# Patient Record
Sex: Female | Born: 1980 | Hispanic: Yes | Marital: Married | Smoking: Never smoker
Health system: Southern US, Community
[De-identification: ages and names within clinical notes are randomized; demographics above are authoritative.]

## PROBLEM LIST (undated history)

## (undated) ENCOUNTER — Inpatient Hospital Stay (HOSPITAL_COMMUNITY): Payer: Self-pay

## (undated) DIAGNOSIS — B999 Unspecified infectious disease: Secondary | ICD-10-CM

## (undated) DIAGNOSIS — D649 Anemia, unspecified: Secondary | ICD-10-CM

## (undated) DIAGNOSIS — T8859XA Other complications of anesthesia, initial encounter: Secondary | ICD-10-CM

## (undated) DIAGNOSIS — O039 Complete or unspecified spontaneous abortion without complication: Secondary | ICD-10-CM

## (undated) DIAGNOSIS — O139 Gestational [pregnancy-induced] hypertension without significant proteinuria, unspecified trimester: Secondary | ICD-10-CM

## (undated) DIAGNOSIS — N83209 Unspecified ovarian cyst, unspecified side: Secondary | ICD-10-CM

## (undated) DIAGNOSIS — O1420 HELLP syndrome (HELLP), unspecified trimester: Secondary | ICD-10-CM

## (undated) DIAGNOSIS — T4145XA Adverse effect of unspecified anesthetic, initial encounter: Secondary | ICD-10-CM

## (undated) HISTORY — DX: Gestational (pregnancy-induced) hypertension without significant proteinuria, unspecified trimester: O13.9

## (undated) HISTORY — DX: HELLP syndrome (HELLP), unspecified trimester: O14.20

## (undated) HISTORY — DX: Other complications of anesthesia, initial encounter: T88.59XA

## (undated) HISTORY — PX: NO PAST SURGERIES: SHX2092

## (undated) NOTE — *Deleted (*Deleted)
OBSTETRIC ADMISSION HISTORY AND PHYSICAL  Kristina Gordon is a 89 y.o. female G43P1011 with IUP at [redacted]w[redacted]d by *** presenting for ***. She reports +FMs, No LOF, no VB, no blurry vision, headaches or peripheral edema, and RUQ pain.  She plans on *** feeding. She request *** for birth control. She received her prenatal care at {Blank single:19197::"[facility]","[facility]"}   Dating: By *** --->  Estimated Date of Delivery: 03/29/20  Sono:   @***w***d, normal anatomy, *** presentation, ***  *** lie, ***g, ***% EFW   Prenatal History/Complications: ***  Past Medical History: Past Medical History:  Diagnosis Date  . Anemia   . Complication of anesthesia    pt. states it took her " time " to wake up   . HELLP (hemolytic anemia/elev liver enzymes/low platelets in pregnancy)   . Infection    UTI  . Miscarriage   . Ovarian cyst   . Pregnancy induced hypertension     Past Surgical History: Past Surgical History:  Procedure Laterality Date  . CESAREAN SECTION N/A 06/10/2015   Procedure: CESAREAN SECTION;  Surgeon: Jaymes Graff, MD;  Location: [facility];  Service: Obstetrics;  Laterality: N/A;    Obstetrical History: OB History    Gravida  3   Para  1   Term  1   Preterm      AB  1   Living  1     SAB  0   TAB  0   Ectopic  1   Multiple  0   Live Births  1           Social History Social History   Socioeconomic History  . Marital status: Married    Spouse name: Not on file  . Number of children: Not on file  . Years of education: Not on file  . Highest education level: Not on file  Occupational History  . Not on file  Tobacco Use  . Smoking status: Never Smoker  . Smokeless tobacco: Never Used  Vaping Use  . Vaping Use: Never used  Substance and Sexual Activity  . Alcohol use: No  . Drug use: No  . Sexual activity: Yes    Birth control/protection: None  Other Topics Concern  . Not on file  Social History Narrative  . Not on file    Social Determinants of Health   Financial Resource Strain:   . Difficulty of Paying Living Expenses: Not on file  Food Insecurity: No Food Insecurity  . Worried About Programme researcher, broadcasting/film/video in the Last Year: Never true  . Ran Out of Food in the Last Year: Never true  Transportation Needs: No Transportation Needs  . Lack of Transportation (Medical): No  . Lack of Transportation (Non-Medical): No  Physical Activity:   . Days of Exercise per Week: Not on file  . Minutes of Exercise per Session: Not on file  Stress:   . Feeling of Stress : Not on file  Social Connections:   . Frequency of Communication with Friends and Family: Not on file  . Frequency of Social Gatherings with Friends and Family: Not on file  . Attends Religious Services: Not on file  . Active Member of Clubs or Organizations: Not on file  . Attends Banker Meetings: Not on file  . Marital Status: Not on file    Family History: Family History  Problem Relation Age of Onset  . Hypertension Maternal Grandfather   . Cancer Maternal Grandfather  leukemia  . Varicose Veins Mother   . Hyperlipidemia Father     Allergies: No Known Allergies  Medications Prior to Admission  Medication Sig Dispense Refill Last Dose  . aspirin EC 81 MG tablet Take 1 tablet (81 mg total) by mouth daily. Take after 12 weeks for prevention of preeclampsia later in pregnancy 300 tablet 2   . Prenatal Multivit-Min-Fe-FA (PRENATAL/IRON) TABS Take 1 tablet by mouth daily.         Review of Systems   All systems reviewed and negative except as stated in HPI  Blood pressure (!) 153/109, pulse 91, temperature 98 F (36.7 C), temperature source Oral, height 4\' 8"  (1.422 m), weight 55.9 kg, last menstrual period 06/23/2019, unknown if currently breastfeeding. General appearance: {general exam:16600} Lungs: clear to auscultation bilaterally Heart: regular rate and rhythm Abdomen: soft, non-tender; bowel sounds normal  Pelvic: *** Extremities: Homans sign is negative, no sign of DVT DTR's *** Presentation: {desc; fetal presentation:14558} Fetal monitoring{findings; monitor fetal heart monitor:31527} Uterine activity{Uterine contractions:31516}     Prenatal labs: ABO, Rh: A/Positive/-- (07/14 1513) Antibody: Negative (07/14 1513) Rubella: 11.30 (07/14 1513) RPR: Non Reactive (08/12 0835)  HBsAg: Negative (07/14 1513)  HIV: Non Reactive (08/12 0835)  GBS: Negative/-- (10/07 1331)  1 hr Glucola *** Genetic screening  *** Anatomy US ***  Prenatal Transfer Tool  Maternal Diabetes: {Maternal Diabetes:3043596} Genetic Screening: {Genetic Screening:20205} Maternal Ultrasounds/Referrals: {Maternal Ultrasounds / Referrals:20211} Fetal Ultrasounds or other Referrals:  {Fetal Ultrasounds or Other Referrals:20213} Maternal Substance Abuse:  {Maternal Substance Abuse:20223} Significant Maternal Medications:  {Significant Maternal Meds:20233} Significant Maternal Lab Results: {Significant Maternal Lab Results:20235}  No results found for this or any previous visit (from the past 24 hour(s)).  Patient Active Problem List   Diagnosis Date Noted  . 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 Marginal insertion of umbilical cord affecting management of mother 01/10/2020  . Supervision of high risk pregnancy, antepartum 12/12/2019  . History of HELLP syndrome, currently pregnant 12/12/2019  . S/P cesarean section 06/13/2015  . Language barrier 03/10/2015  . Syncope 03/10/2015  . AMA (advanced maternal age) multigravida 35+ 03/10/2015  . SS (short stature) 03/10/2015    Assessment/Plan:  Kristina Gordon is a 43 y.o. G3P1011 at [redacted]w[redacted]d here for***  #Labor:*** #Pain: *** #FWB: *** #ID:  *** #MOF: *** #MOC:*** #Circ:  ***  Herby Abraham MD, PGY-1

---
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
## 2013-11-10 ENCOUNTER — Inpatient Hospital Stay (HOSPITAL_COMMUNITY): Payer: Self-pay

## 2013-11-10 ENCOUNTER — Ambulatory Visit (INDEPENDENT_AMBULATORY_CARE_PROVIDER_SITE_OTHER): Payer: Self-pay | Admitting: Internal Medicine

## 2013-11-10 ENCOUNTER — Encounter (HOSPITAL_COMMUNITY): Payer: Self-pay

## 2013-11-10 ENCOUNTER — Inpatient Hospital Stay (HOSPITAL_COMMUNITY)
Admission: AD | Admit: 2013-11-10 | Discharge: 2013-11-10 | Disposition: A | Discharge status: Home / Self Care | Payer: Self-pay | Source: Ambulatory Visit | Attending: Obstetrics and Gynecology | Admitting: Obstetrics and Gynecology

## 2013-11-10 VITALS — BP 110/80 | HR 71 | Temp 98.3°F | Resp 16 | Ht <= 58 in | Wt 106.4 lb

## 2013-11-10 DIAGNOSIS — O9989 Other specified diseases and conditions complicating pregnancy, childbirth and the puerperium: Secondary | ICD-10-CM

## 2013-11-10 DIAGNOSIS — N912 Amenorrhea, unspecified: Secondary | ICD-10-CM

## 2013-11-10 DIAGNOSIS — R1032 Left lower quadrant pain: Secondary | ICD-10-CM | POA: Insufficient documentation

## 2013-11-10 DIAGNOSIS — R109 Unspecified abdominal pain: Secondary | ICD-10-CM

## 2013-11-10 DIAGNOSIS — O99891 Other specified diseases and conditions complicating pregnancy: Secondary | ICD-10-CM | POA: Insufficient documentation

## 2013-11-10 DIAGNOSIS — O26899 Other specified pregnancy related conditions, unspecified trimester: Secondary | ICD-10-CM

## 2013-11-10 DIAGNOSIS — O26859 Spotting complicating pregnancy, unspecified trimester: Secondary | ICD-10-CM | POA: Insufficient documentation

## 2013-11-10 LAB — POCT URINALYSIS DIPSTICK
Bilirubin, UA: NEGATIVE
Glucose, UA: NEGATIVE
Ketones, UA: NEGATIVE
LEUKOCYTES UA: NEGATIVE
Nitrite, UA: NEGATIVE
PROTEIN UA: NEGATIVE
Spec Grav, UA: <=1.005
UROBILINOGEN UA: 0.2
pH, UA: 6

## 2013-11-10 LAB — POCT UA - MICROSCOPIC ONLY
CASTS, UR, LPF, POC: NEGATIVE
CRYSTALS, UR, HPF, POC: NEGATIVE
Mucus, UA: NEGATIVE
YEAST UA: NEGATIVE

## 2013-11-10 LAB — CBC
HCT: 33.7 % — ABNORMAL LOW (ref 36.0–46.0)
Hemoglobin: 11.3 g/dL — ABNORMAL LOW (ref 12.0–15.0)
MCH: 29 pg (ref 26.0–34.0)
MCHC: 33.5 g/dL (ref 30.0–36.0)
MCV: 86.4 fL (ref 78.0–100.0)
Platelets: 269 10*3/uL (ref 150–400)
RBC: 3.9 MIL/uL (ref 3.87–5.11)
RDW: 14 % (ref 11.5–15.5)
WBC: 5.5 10*3/uL (ref 4.0–10.5)

## 2013-11-10 LAB — HCG, QUANTITATIVE, PREGNANCY: hCG, Beta Chain, Quant, S: 388 m[IU]/mL — ABNORMAL HIGH (ref <5)

## 2013-11-10 LAB — ABO/RH: ABO/RH(D): A POS

## 2013-11-10 LAB — POCT URINE PREGNANCY: Preg Test, Ur: POSITIVE

## 2013-11-10 NOTE — MAU Provider Note (Signed)
Attestation of Attending Supervision of Advanced Practitioner: Evaluation and management procedures were performed by the PA/NP/CNM/OB Fellow under my supervision/collaboration. Chart reviewed and agree with management and plan.  Maziah Smola V 11/10/2013 6:11 PM

## 2013-11-10 NOTE — Progress Notes (Signed)
Subjective:    Patient ID: Kristina Gordon, female    DOB: 12/18/1980, 33 y.o.   MRN: [id number]  This chart was scribed for Ellamae Siaobert Berlyn Saylor, MD by Jarvis Morganaylor Ferguson, Medical Scribe. First urgent medical and family care visit  HPI HPI Comments: Kristina FlingRosa Gordon is a 33 y.o. female who presents to the Emergency Department complaining of intermittent, mild, LLQ abdominal pain. She states she is having associated mild subjective fever, mild dysuria and back pain. She states that her LNMP was on 10/05/13 and that it was shorter than normal and the blood was darker in color that usual. Patient is unsure if she could be pregnant. She denies any nausea or nocturia.  With translator- has had irregular periods for a long time and wastold in [place] that she not be able to get pregnant because her ovulation was too infrequent. Uses no contraception .same partner for many years .has never been pregnant   in recent months she has had vaginal bleeding for 7-10 days when she does bleed .most recent ten-day stretch was early in May although she has had some spotting in the last 24 hours   she has now complained of left lower quadrant pain over the last 30 hours which kept her awake all night  Mild nausea today but no vomiting No dysuria or frequency or urgency No other health problems known    Review of Systems  Constitutional: Positive for fever (subjective).  Gastrointestinal: Positive for abdominal pain (LLQ). Negative for nausea.  Genitourinary: Positive for dysuria (mild).       No nocturia  Musculoskeletal: Positive for back pain.  All other systems reviewed and are negative.  no bruising or bleeding from gums      Objective:   Physical Exam  Nursing note and vitals reviewed. Constitutional: She is oriented to person, place, and time. She appears well-developed and well-nourished. No distress.  HENT:  Head: Normocephalic and atraumatic.  Eyes: Conjunctivae and EOM are normal.  Neck: Normal range  of motion.  Cardiovascular: Normal rate.   Pulmonary/Chest: Effort normal. No respiratory distress.  Abdominal: Soft. There is tenderness. There is no rebound and no guarding.  LLQ tenderness to palpation w/o mass. No CVA tenderness to percussion  Genitourinary:  Introitus clear There is a large slightly friable cervical polyp extruding from the os The uterus is enlarged approximately 1/2 way to umbilicus, and mildly tender She is mildly tender in both adnexae without additional mass impression Fetal heart tone monitor reveals no fetal heart tones but there is a loud placental souffl or sounds from the iliacs on each side  Musculoskeletal: Normal range of motion.  Neurological: She is alert and oriented to person, place, and time.  Skin: Skin is warm and dry.  Psychiatric: She has a normal mood and affect. Her behavior is normal.    Results for orders placed in visit on 11/10/13  POCT UA - MICROSCOPIC ONLY      Result Value Ref Range   WBC, Ur, HPF, POC 0-1     RBC, urine, microscopic 2-4     Bacteria, U Microscopic trace     Mucus, UA negative     Epithelial cells, urine per micros 2-8     Crystals, Ur, HPF, POC negative     Casts, Ur, LPF, POC negative     Yeast, UA negative    POCT URINALYSIS DIPSTICK      Result Value Ref Range   Color, UA yellow     Clarity,  UA clear     Glucose, UA negative     Bilirubin, UA negative     Ketones, UA negative     Spec Grav, UA <=1.005     Blood, UA moderate     pH, UA 6.0     Protein, UA negative     Urobilinogen, UA 0.2     Nitrite, UA negative     Leukocytes, UA Negative    POCT URINE PREGNANCY      Result Value Ref Range   Preg Test, Ur Positive          Assessment &  Plan:  Left lower quadrant abdominal pain Intrauterine pregnancy possibly nonviable  [facility] and definitive evaluation  I have completed the patient encounter in its entirety as documented by the scribe, with editing  by me where necessary. Lydia Meng P. Merla Richesoolittle, M.D.

## 2013-11-10 NOTE — MAU Provider Note (Signed)
History     CSN: [id number]  Arrival date and time: 11/10/13 1024   None     Chief Complaint  Patient presents with  . Abdominal Pain  . Back Pain   HPI 33 y.o. G1P0 at 5034w1d by LMP with LLQ pain x 2 days. + Spotting. Was sent from Urgent Care for further eval, pelvic exam there revealed mild bilateral adnexal tenderness and cervical polyp.   Past Medical History  Diagnosis Date  . Medical history non-contributory     Past Surgical History  Procedure Laterality Date  . No past surgeries      History reviewed. No pertinent family history.  History  Substance Use Topics  . Smoking status: Never Smoker   . Smokeless tobacco: Never Used  . Alcohol Use: No    Allergies: No Known Allergies  Prescriptions prior to admission  Medication Sig Dispense Refill  . Acetaminophen-Caff-Pyrilamine 500-60-15 MG TABS Take 1 tablet by mouth daily as needed (for pain/cramps).      . APAP-Pamabrom-Pyrilamine (MIDOL MAXIMUM STRENGTH PMS) 500-25-15 MG TABS Take 1 tablet by mouth daily as needed (for pain/cramps).        Review of Systems  Constitutional: Negative.   Respiratory: Negative.   Cardiovascular: Negative.   Gastrointestinal: Positive for abdominal pain. Negative for nausea, vomiting, diarrhea and constipation.  Genitourinary: Negative for dysuria, urgency, frequency, hematuria and flank pain.       Positive for spotting, negative discharge  Musculoskeletal: Negative.   Neurological: Negative.   Psychiatric/Behavioral: Negative.    Physical Exam   Blood pressure 124/80, pulse 79, temperature 98.7 F (37.1 C), temperature source Oral, resp. rate 16, last menstrual period 10/05/2013.  Physical Exam  Nursing note and vitals reviewed. Constitutional: She is oriented to person, place, and time. She appears well-developed and well-nourished. No distress.  Cardiovascular: Normal rate.   Respiratory: Effort normal.  GI: Soft. She exhibits no mass. There is tenderness (LLQ  ). There is no rebound and no guarding.  Musculoskeletal: Normal range of motion.  Neurological: She is alert and oriented to person, place, and time.  Skin: Skin is warm and dry.    MAU Course  Procedures Results for orders placed during the hospital encounter of 11/10/13 (from the past 24 hour(s))  HCG, QUANTITATIVE, PREGNANCY     Status: Abnormal   Collection Time    11/10/13 11:49 AM      Result Value Ref Range   hCG, Beta Chain, Quant, S 388 (*) <5 mIU/mL  CBC     Status: Abnormal   Collection Time    11/10/13 11:49 AM      Result Value Ref Range   WBC 5.5  4.0 - 10.5 K/uL   RBC 3.90  3.87 - 5.11 MIL/uL   Hemoglobin 11.3 (*) 12.0 - 15.0 g/dL   HCT 40.933.7 (*) 81.136.0 - 91.446.0 %   MCV 86.4  78.0 - 100.0 fL   MCH 29.0  26.0 - 34.0 pg   MCHC 33.5  30.0 - 36.0 g/dL   RDW 78.214.0  95.611.5 - 21.315.5 %   Platelets 269  150 - 400 K/uL  ABO/RH     Status: None   Collection Time    11/10/13 11:49 AM      Result Value Ref Range   ABO/RH(D) A POS     [place] Ob Comp Less 14 Wks  11/10/2013   CLINICAL DATA:  Left lower quadrant pain.  Bleeding.  EXAM: OBSTETRIC <14 WK ULTRASOUND  TECHNIQUE:  Transabdominal ultrasound was performed for evaluation of the gestation as well as the maternal uterus and adnexal regions.  COMPARISON:  None.  FINDINGS: Intrauterine gestational sac: None visualized  Yolk sac:  None visualized  Embryo:  None visualized  Maternal uterus/adnexae: Ovaries are symmetric in size and echotexture. No adnexal masses. Small amount of free fluid in the pelvis. Uterus is retroverted.  IMPRESSION: No intrauterine pregnancy visualized. Differential considerations would include early intrauterine pregnancy too early to visualize, spontaneous abortion, or occult ectopic pregnancy. Recommend close clinical followup and serial quantitative beta HCGs and ultrasounds.   Electronically Signed   By: Charlett NoseKevin  Dover M.D.   On: 11/10/2013 14:36   [place] Ob Transvaginal  11/10/2013   CLINICAL DATA:  Left lower  quadrant pain.  Bleeding.  EXAM: OBSTETRIC <14 WK ULTRASOUND  TECHNIQUE: Transabdominal ultrasound was performed for evaluation of the gestation as well as the maternal uterus and adnexal regions.  COMPARISON:  None.  FINDINGS: Intrauterine gestational sac: None visualized  Yolk sac:  None visualized  Embryo:  None visualized  Maternal uterus/adnexae: Ovaries are symmetric in size and echotexture. No adnexal masses. Small amount of free fluid in the pelvis. Uterus is retroverted.  IMPRESSION: No intrauterine pregnancy visualized. Differential considerations would include early intrauterine pregnancy too early to visualize, spontaneous abortion, or occult ectopic pregnancy. Recommend close clinical followup and serial quantitative beta HCGs and ultrasounds.   Electronically Signed   By: Charlett NoseKevin  Dover M.D.   On: 11/10/2013 14:36    Assessment and Plan   1. Abdominal pain in pregnancy, antepartum   No IUP or adnexal mass on u/s today, f/u quant Tuesday, precautions rev'd    Medication List    STOP taking these medications       Acetaminophen-Caff-Pyrilamine 500-60-15 MG Tabs     MIDOL MAXIMUM STRENGTH PMS 500-25-15 MG Tabs  Generic drug:  APAP-Pamabrom-Pyrilamine            Follow-up Information   [facility] In 3 days. (for repeat labs)    Contact information:   [address] [id number] [place] [id number] [phone]        Anessia Oakland 11/10/2013, 3:11 PM

## 2013-11-13 ENCOUNTER — Inpatient Hospital Stay (HOSPITAL_COMMUNITY)
Admission: AD | Admit: 2013-11-13 | Discharge: 2013-11-13 | Disposition: A | Discharge status: Home / Self Care | Payer: Self-pay | Source: Ambulatory Visit | Attending: Obstetrics & Gynecology | Admitting: Obstetrics & Gynecology

## 2013-11-13 DIAGNOSIS — O26899 Other specified pregnancy related conditions, unspecified trimester: Secondary | ICD-10-CM

## 2013-11-13 DIAGNOSIS — R109 Unspecified abdominal pain: Secondary | ICD-10-CM | POA: Insufficient documentation

## 2013-11-13 DIAGNOSIS — O26859 Spotting complicating pregnancy, unspecified trimester: Secondary | ICD-10-CM | POA: Insufficient documentation

## 2013-11-13 DIAGNOSIS — O9989 Other specified diseases and conditions complicating pregnancy, childbirth and the puerperium: Secondary | ICD-10-CM

## 2013-11-13 LAB — HCG, QUANTITATIVE, PREGNANCY: hCG, Beta Chain, Quant, S: 263 m[IU]/mL — ABNORMAL HIGH (ref <5)

## 2013-11-13 NOTE — MAU Provider Note (Signed)
  History     CSN: [id number]  Arrival date and time: 11/13/13 1028   None     Chief Complaint  Patient presents with  . Follow-up   HPI  Pt is a 33 yo G1P0 at 495w4d wks IUP here for repeat bhcg.  Seen in MAU on 6/13 for LLQ pelvic pain.  BHCG that day was 388.  Ultrasound showed no IUGS or adnexal mass.  Pt reports mild pelvic pain and spotting of blood with wiping.  Pain last approximated 30 sec.    Past Medical History  Diagnosis Date  . Medical history non-contributory     Past Surgical History  Procedure Laterality Date  . No past surgeries      No family history on file.  History  Substance Use Topics  . Smoking status: Never Smoker   . Smokeless tobacco: Never Used  . Alcohol Use: No    Allergies: No Known Allergies  No prescriptions prior to admission    Review of Systems  Gastrointestinal: Positive for abdominal pain (pelvic pain).  Genitourinary:       Spotting of blood.  All other systems reviewed and are negative.  Physical Exam   Blood pressure 114/82, pulse 82, temperature 98.9 F (37.2 C), temperature source Oral, resp. rate 16, last menstrual period 10/05/2013.  Physical Exam  Constitutional: She is oriented to person, place, and time. She appears well-developed and well-nourished. No distress.  HENT:  Head: Normocephalic.  Neck: Normal range of motion. Neck supple.  Cardiovascular: Normal rate, regular rhythm and normal heart sounds.   Respiratory: Effort normal and breath sounds normal.  GI: Soft.  Musculoskeletal: Normal range of motion. She exhibits no edema.  Neurological: She is alert and oriented to person, place, and time. She has normal reflexes.  Skin: Skin is warm and dry.    MAU Course  Procedures  Results for orders placed during the hospital encounter of 11/13/13 (from the past 24 hour(s))  HCG, QUANTITATIVE, PREGNANCY     Status: Abnormal   Collection Time    11/13/13 10:45 AM      Result Value Ref Range   hCG, Beta  Chain, Quant, S 263 (*) <5 mIU/mL    Assessment and Plan  Abdominal Pain/Bleeding in Pregnancy  Plan: Repeat BHCG in 48 hrs at clinic Ectopic and bleeding precautions Explained possible miscarriage.  [facility] Heights[facility]UHAMMAD,WALIDAH 11/13/2013, 11:06 AM

## 2013-11-13 NOTE — MAU Note (Signed)
Patient to MAU for a follow up BHCG. States she had pain that comes and goes, none now. States she has a small amount of bleeding.

## 2013-11-13 NOTE — Discharge Instructions (Signed)
Dolor abdominal en el embarazo  (Abdominal Pain During Pregnancy)  El dolor abdominal es frecuente durante el embarazo. Generalmente no causa ningún daño. El dolor abdominal puede tener numerosas causas. Algunas causas son más graves que otras. Ciertas causas de dolor abdominal durante el embarazo se diagnostican fácilmente. A veces, se tarda un tiempo para llegar al diagnóstico. Otras veces la causa no se conoce. El dolor abdominal puede estar relacionado con alguna alteración del embarazo, o puede deberse a una causa totalmente diferente. Por este motivo, siempre consulte a su médico cuando sienta molestias abdominales.  INSTRUCCIONES PARA EL CUIDADO EN EL HOGAR   Esté atenta al dolor para ver si hay cambios. Las siguientes indicaciones ayudarán a aliviar cualquier molestia que pueda sentir:  · No tenga relaciones sexuales y no coloque nada dentro de la vagina hasta que los síntomas hayan desaparecido completamente.  · Descanse todo lo que pueda hasta que el dolor se le haya calmado.  · Si siente náuseas, beba líquidos claros. Evite los alimentos sólidos mientras sienta malestar o tenga náuseas.  · Tome sólo medicamentos de venta libre o recetados, según las indicaciones del médico.  · Cumpla con todas las visitas de control, según le indique su médico.  SOLICITE ATENCIÓN MÉDICA DE INMEDIATO SI:  · Tiene un sangrado, pérdida de líquidos o elimina tejidos por la vagina.  · El dolor o los cólicos aumentan.  · Tiene vómitos persistentes.  · Comienza a sentir dolor al orinar u observa sangre.  · Tiene fiebre.  · Nota que los movimientos del bebé disminuyen.  · Siente intensa debilidad o se marea.  · Tiene dificultad para respirar con o sin dolor abdominal.  · Siente un dolor de cabeza intenso junto al dolor abdominal.  · Tiene una secreción vaginal anormal con dolor abdominal.  · Tiene diarrea persistente.  · El dolor abdominal sigue o empeora aún después de hacer reposo.  ASEGÚRESE DE QUE:   · Comprende estas  instrucciones.  · Controlará su afección.  · Recibirá ayuda de inmediato si no mejora o si empeora.  Document Released: 05/17/2005 Document Revised: 03/07/2013  [employer]® Patient Information ©2014 [employer].

## 2013-11-13 NOTE — MAU Provider Note (Signed)
Attestation of Attending Supervision of Advanced Practitioner (PA/CNM/NP): Evaluation and management procedures were performed by the Advanced Practitioner under my supervision and collaboration.  I have reviewed the Advanced Practitioner's note and chart, and I agree with the management and plan.  Trevaun Rendleman, MD, FACOG Attending Obstetrician & Gynecologist Faculty Practice, [facility]  

## 2013-11-15 ENCOUNTER — Other Ambulatory Visit: Payer: Self-pay

## 2013-11-15 DIAGNOSIS — O039 Complete or unspecified spontaneous abortion without complication: Secondary | ICD-10-CM

## 2013-11-15 LAB — HCG, QUANTITATIVE, PREGNANCY: hCG, Beta Chain, Quant, S: 231.2 m[IU]/mL

## 2013-11-16 ENCOUNTER — Encounter: Payer: Self-pay | Admitting: *Deleted

## 2013-11-16 ENCOUNTER — Telehealth: Payer: Self-pay | Admitting: *Deleted

## 2013-11-16 NOTE — Telephone Encounter (Signed)
Attempted to contact patient with [employer] interpreter, no answer, unable to leave a voice mail, will send certified letter./  Letter sent.

## 2013-11-16 NOTE — Telephone Encounter (Signed)
Called patient with [employer] interpreter [phone], no answer, unable to leave a message due to voicemail not being set up.

## 2013-11-18 ENCOUNTER — Inpatient Hospital Stay (HOSPITAL_COMMUNITY): Payer: Self-pay

## 2013-11-18 ENCOUNTER — Encounter (HOSPITAL_COMMUNITY): Payer: Self-pay | Admitting: *Deleted

## 2013-11-18 ENCOUNTER — Inpatient Hospital Stay (HOSPITAL_COMMUNITY)
Admission: AD | Admit: 2013-11-18 | Discharge: 2013-11-18 | Disposition: A | Discharge status: Home / Self Care | Payer: Self-pay | Source: Ambulatory Visit | Attending: Obstetrics & Gynecology | Admitting: Obstetrics & Gynecology

## 2013-11-18 DIAGNOSIS — O039 Complete or unspecified spontaneous abortion without complication: Secondary | ICD-10-CM | POA: Insufficient documentation

## 2013-11-18 DIAGNOSIS — R109 Unspecified abdominal pain: Secondary | ICD-10-CM | POA: Insufficient documentation

## 2013-11-18 HISTORY — DX: Complete or unspecified spontaneous abortion without complication: O03.9

## 2013-11-18 LAB — DIFFERENTIAL
BASOS ABS: 0 10*3/uL (ref 0.0–0.1)
BASOS PCT: 0 % (ref 0–1)
Eosinophils Absolute: 0.1 10*3/uL (ref 0.0–0.7)
Eosinophils Relative: 1 % (ref 0–5)
Lymphocytes Relative: 17 % (ref 12–46)
Lymphs Abs: 1.3 10*3/uL (ref 0.7–4.0)
Monocytes Absolute: 0.7 10*3/uL (ref 0.1–1.0)
Monocytes Relative: 9 % (ref 3–12)
NEUTROS ABS: 5.7 10*3/uL (ref 1.7–7.7)
NEUTROS PCT: 73 % (ref 43–77)

## 2013-11-18 LAB — BUN: BUN: 9 mg/dL (ref 6–23)

## 2013-11-18 LAB — CBC
HEMATOCRIT: 34.5 % — AB (ref 36.0–46.0)
Hemoglobin: 11.5 g/dL — ABNORMAL LOW (ref 12.0–15.0)
MCH: 28.9 pg (ref 26.0–34.0)
MCHC: 33.3 g/dL (ref 30.0–36.0)
MCV: 86.7 fL (ref 78.0–100.0)
Platelets: 270 10*3/uL (ref 150–400)
RBC: 3.98 MIL/uL (ref 3.87–5.11)
RDW: 14.3 % (ref 11.5–15.5)
WBC: 7.9 10*3/uL (ref 4.0–10.5)

## 2013-11-18 LAB — CREATININE, SERUM
CREATININE: 0.73 mg/dL (ref 0.50–1.10)
GFR calc Af Amer: >90 mL/min (ref >90)
GFR calc non Af Amer: >90 mL/min (ref >90)

## 2013-11-18 LAB — AST: AST: 15 U/L (ref 0–37)

## 2013-11-18 LAB — HCG, QUANTITATIVE, PREGNANCY: hCG, Beta Chain, Quant, S: 177 m[IU]/mL — ABNORMAL HIGH (ref <5)

## 2013-11-18 NOTE — Discharge Instructions (Signed)
Aborto espontáneo  °(Miscarriage) °El aborto espontáneo es la pérdida de un bebé que no ha nacido (feto) antes de la semana 20 del embarazo. La mayor parte de estos abortos ocurre en los primeros 3 meses. En algunos casos ocurre antes de que la mujer sepa que está embarazada. También se denomina "aborto espontáneo" o "pérdida prematura del embarazo". El aborto espontáneo puede ser una experiencia que afecte emocionalmente a la persona. Converse con su médico si tiene dudas, cómo es el proceso de duelo, y sobre planes futuros de embarazo.  °CAUSAS  °· Algunos problemas cromosómicos pueden hacer imposible que el bebé se desarrolle normalmente. Los problemas con los genes o cromosomas del bebé son generalmente el resultado de errores que se producen, por casualidad, cuando el embrión se divide y crece. Estos problemas no se heredan de los padres. °· Infección en el cuello del útero.   °· Problemas hormonales.   °· Problemas en el cuello del útero, como tener un útero incompetente. Esto ocurre cuando los tejidos no son lo suficientemente fuertes como para contener el embarazo.   °· Problemas del útero, como un útero con forma anormal, los fibromas o anormalidades congénitas.   °· Ciertas enfermedades crónicas.   °· No fume, no beba alcohol, ni consuma drogas.   °· Traumatismos   °A veces, la causa es desconocida.  °SÍNTOMAS  °· Sangrado o manchado vaginal, con o sin cólicos o dolor. °· Dolor o cólicos en el abdomen o en la cintura. °· Eliminación de líquido, tejidos o coágulos grandes por la vagina. °DIAGNÓSTICO  °El médico le hará un examen físico. También le indicará una ecografía para confirmar el aborto. Es posible que se realicen análisis de sangre.  °TRATAMIENTO  °· En algunos casos el tratamiento no es necesario, si se eliminan naturalmente todos los tejidos embrionarios que se encontraban en el útero. Si el feto o la placenta quedan dentro del útero (aborto incompleto), pueden infectarse, los tejidos que quedan  pueden infectarse y deben retirarse. Generalmente se realiza un procedimiento de dilatación y curetaje (D y C). Durante el procedimiento de dilatación y curetaje, el cuello del útero se abre (dilata) y se retira cualquier resto de tejido fetal o placentario del útero. °· Si hay una infección, le recetarán antibióticos. Podrán recetarle otros medicamentos para reducir el tamaño del útero (contraerlo) si hay una mucho sangrado. °· Si su sangre es Rh negativa y su bebé es Rh positivo, usted necesitará la inyección de inmunoglobulina Rh. Esta inyección protegerá a los futuros bebés de tener problemas de compatibilidad Rh en futuros embarazos. °INSTRUCCIONES PARA EL CUIDADO EN EL HOGAR  °· El médico le indicará reposo en cama o le permitirá realizar actividades livianas. Vuelva a la actividad lentamente o según las indicaciones de su médico. °· Pídale a alguien que la ayude con las responsabilidades familiares y del hogar durante este tiempo.   °· Lleve un registro de la cantidad y la saturación de las toallas higiénicas que utiliza cada día. Anote esta información   °· No use tampones. No No se haga duchas vaginales ni tenga relaciones sexuales hasta que el médico la autorice.   °· Sólo tome medicamentos de venta libre o recetados para calmar el dolor o el malestar, según las indicaciones de su médico.   °· No tome aspirina. La aspirina puede ocasionar hemorragias.   °· Concurra puntualmente a las citas de control con el médico.   °· Si usted o su pareja tienen dificultades con el duelo, hable con su médico para buscar la ayuda psicológica que los ayude a enfrentar la pérdida   del embarazo. Permítase el tiempo suficiente de duelo antes de quedar embarazada nuevamente.   °SOLICITE ATENCIÓN MÉDICA DE INMEDIATO SI:  °· Siente calambres intensos o dolor en la espalda o en el abdomen. °· Tiene fiebre. °· Elimina grandes coágulos de sangre (del tamaño de una nuez o más) o tejidos por la vagina. Guarde lo que ha eliminado para  que su médico lo examine.   °· La hemorragia aumenta.   °· Observa una secreción vaginal espesa y con mal olor. °· Se siente mareada, débil, o se desmaya.   °· Siente escalofríos.   °ASEGÚRESE DE QUE:  °· Comprende estas instrucciones. °· Controlará su enfermedad. °· Solicitará ayuda de inmediato si no mejora o si empeora. °Document Released: 02/24/2005 Document Revised: 09/11/2012 °Exit[employer]® Patient Information ©2015 [employer]. This information is not intended to replace advice given to you by your health care provider. Make sure you discuss any questions you have with your health care provider. ° °

## 2013-11-18 NOTE — MAU Note (Signed)
Pt presents to MAU with complaints of lower abdominal pain, states the pain started yesterday and is having vaginal bleeding like a period.

## 2013-11-18 NOTE — MAU Provider Note (Signed)
History     CSN: [id number]  Arrival date and time: 11/18/13 0934   None     Chief Complaint  Patient presents with  . Labs Only  . Abdominal Pain   Abdominal Pain    Kristina Gordon is a 33 y.o. G1P0 at 5278w2d who presents today with bleeding and cramping. She states that she started to have menstrual like bleeding last night, and cramping this morning. She rates her pain 8/10, and states that when she took tylenol last night the pain improved.   Past Medical History  Diagnosis Date  . Miscarriage     Past Surgical History  Procedure Laterality Date  . No past surgeries      History reviewed. No pertinent family history.  History  Substance Use Topics  . Smoking status: Never Smoker   . Smokeless tobacco: Never Used  . Alcohol Use: No    Allergies: No Known Allergies  Prescriptions prior to admission  Medication Sig Dispense Refill  . acetaminophen (TYLENOL) 500 MG tablet Take 500 mg by mouth every 6 (six) hours as needed for moderate pain.        Review of Systems  Gastrointestinal: Positive for abdominal pain.   Physical Exam   Blood pressure 129/89, pulse 97, temperature 97.8 F (36.6 C), last menstrual period 10/05/2013, unknown if currently breastfeeding.  Physical Exam  Nursing note and vitals reviewed. Constitutional: She is oriented to person, place, and time. She appears well-developed and well-nourished. No distress.  Cardiovascular: Normal rate.   Respiratory: Effort normal.  GI: There is no tenderness. There is no rebound.  Neurological: She is alert and oriented to person, place, and time.  Skin: Skin is warm and dry.  Psychiatric: She has a normal mood and affect.    MAU Course  Procedures  Results for orders placed during the hospital encounter of 11/18/13 (from the past 24 hour(s))  HCG, QUANTITATIVE, PREGNANCY     Status: Abnormal   Collection Time    11/18/13  9:49 AM      Result Value Ref Range   hCG, Beta Chain, Quant,  S 177 (*) <5 mIU/mL  CBC     Status: Abnormal   Collection Time    11/18/13  9:49 AM      Result Value Ref Range   WBC 7.9  4.0 - 10.5 K/uL   RBC 3.98  3.87 - 5.11 MIL/uL   Hemoglobin 11.5 (*) 12.0 - 15.0 g/dL   HCT 40.934.5 (*) 81.136.0 - 91.446.0 %   MCV 86.7  78.0 - 100.0 fL   MCH 28.9  26.0 - 34.0 pg   MCHC 33.3  30.0 - 36.0 g/dL   RDW 78.214.3  95.611.5 - 21.315.5 %   Platelets 270  150 - 400 K/uL   [place] Ob Transvaginal  11/18/2013   CLINICAL DATA:  Early pregnancy, pelvic pain and vaginal bleeding for 1 day, quantitative beta HCG falling from 388 on 11/16/1998 to 231 on 11/16/2011 and now 177 on 11/19/2011 ; no intrauterine pregnancy visualized on preceding ultrasound  EXAM: TRANSVAGINAL OB ULTRASOUND  TECHNIQUE: Transvaginal ultrasound was performed for complete evaluation of the gestation as well as the maternal uterus, adnexal regions, and pelvic cul-de-sac.  COMPARISON:  11/10/2013  FINDINGS: Intrauterine gestational sac: Not identified  Yolk sac:  N/A  Embryo:  N/A  Cardiac Activity: N/A  Heart Rate: N/A bpm  Maternal uterus/adnexae:  Uterus retroverted.  No intrauterine gestational sac, fluid collection, or blood identified.  LEFT  ovary normal size and morphology 1.7 x 2.6 x 1.5 cm.  RIGHT ovary measures 2.8 x 1.9 x 1.9 cm, grossly normal morphology.  Small amount of complex free pelvic fluid containing diffuse echogenicity, question blood.  Posterior to the RIGHT ovary in the RIGHT adnexa and abnormal rounded soft tissue nodule is identified 2.1 x 1.4 x 1.2 cm.  This lacks the typical ring-like appearance of an ectopic pregnancy and does not demonstrate a "ring of fire" appearance on color Doppler imaging.  In the absence of a documented intrauterine pregnancy and with complex fluid/blood in pelvis, however, finding is concerning for an ectopic pregnancy.  No additional adnexal masses.  IMPRESSION: No intrauterine gestational identified.  Unremarkable ovaries.  Nonspecific soft tissue nodule posterior to the  RIGHT ovary 2.1 x 1.4 x 1.2 cm in size with associated complex fluid suspect blood in pelvis.  While lacking the typical appearance, in the absence of a documented intrauterine pregnancy this finding is concerning for an ectopic pregnancy  Serial quantitative beta HCG monitoring and potentially follow-up ultrasound recommended to definitively exclude ectopic pregnancy.  Findings called to Thressa ShellerHeather Hogan in MAU on 11/18/2013 at 1210 hr.   Electronically Signed   By: Ulyses SouthwardMark  Boles M.D.   On: 11/18/2013 12:13   1303: D/W Dr. Marice Potterove, ok for FU in clinic in 1 week.   Assessment and Plan   1. SAB (spontaneous abortion)    Bleeding precautions Return to MAU as needed  Follow-up Information   [facility] (11/26/13 BETWEEN 8-11)    Specialty:  Obstetrics and Gynecology   Contact information:   [address] [place] [address] [phone]       Tawnya CrookHogan, Heather Donovan 11/18/2013, 12:16 PM

## 2013-12-03 ENCOUNTER — Telehealth: Payer: Self-pay | Admitting: *Deleted

## 2013-12-03 ENCOUNTER — Other Ambulatory Visit: Payer: Self-pay

## 2013-12-03 NOTE — Telephone Encounter (Signed)
Contacted patient with the aide of Interpreter [name], pt is unable to come to the clinic until Thursday at 0800, confirmed lab appointment for Thursday at 0800.  Message sent to front desk.

## 2013-12-06 ENCOUNTER — Other Ambulatory Visit: Payer: Self-pay

## 2013-12-06 ENCOUNTER — Telehealth: Payer: Self-pay

## 2013-12-06 NOTE — Telephone Encounter (Signed)
Patient missed today's lab appointment for repeat HCG. Has continually missed since 6/18. Attempted to call patient with interpreter Raynelle FanningJulie. No answer. No voicemail set up-- unable to leave message. Called emergency contact (husband)-- who stated we can reach patient at number [phone] (number already tried). Left message with husband who stated he would have patient call clinic.

## 2013-12-10 NOTE — Telephone Encounter (Signed)
Called patient with interpreter Kristina Gordon. Informed patient she has missed her lab appointment and that we would like to reschedule to ensure pregnancy hormone is no longer present in blood stream. Patient states she feels fine, has had no problems and does not want to come for f/u appointment or lab draw as she is on vacation. Informed patient it could be when she returns and explained that we like to trend results and ensure hormone is no longer present-- explained the importance of follow up. Patient verbalizes that she feels fine, she does not want to follow up and has not had any problems. Requested that patient come to hospital should she have any problems, bleeding, pain etc. Patient verbalized understanding.

## 2013-12-21 ENCOUNTER — Encounter: Payer: Self-pay | Admitting: General Practice

## 2014-01-11 ENCOUNTER — Encounter: Payer: Self-pay | Admitting: General Practice

## 2014-04-01 ENCOUNTER — Encounter (HOSPITAL_COMMUNITY): Payer: Self-pay | Admitting: *Deleted

## 2014-10-26 ENCOUNTER — Inpatient Hospital Stay (HOSPITAL_COMMUNITY)
Admission: AD | Admit: 2014-10-26 | Discharge: 2014-10-26 | Disposition: A | Discharge status: Home / Self Care | Payer: Self-pay | Source: Ambulatory Visit | Attending: Obstetrics & Gynecology | Admitting: Obstetrics & Gynecology

## 2014-10-26 ENCOUNTER — Inpatient Hospital Stay (HOSPITAL_COMMUNITY): Payer: Self-pay

## 2014-10-26 ENCOUNTER — Encounter (HOSPITAL_COMMUNITY): Payer: Self-pay | Admitting: *Deleted

## 2014-10-26 DIAGNOSIS — O209 Hemorrhage in early pregnancy, unspecified: Secondary | ICD-10-CM | POA: Insufficient documentation

## 2014-10-26 DIAGNOSIS — N841 Polyp of cervix uteri: Secondary | ICD-10-CM | POA: Insufficient documentation

## 2014-10-26 DIAGNOSIS — O3441 Maternal care for other abnormalities of cervix, first trimester: Secondary | ICD-10-CM | POA: Insufficient documentation

## 2014-10-26 DIAGNOSIS — Z3A01 Less than 8 weeks gestation of pregnancy: Secondary | ICD-10-CM | POA: Insufficient documentation

## 2014-10-26 HISTORY — DX: Unspecified infectious disease: B99.9

## 2014-10-26 HISTORY — DX: Unspecified ovarian cyst, unspecified side: N83.209

## 2014-10-26 LAB — CBC WITH DIFFERENTIAL/PLATELET
Basophils Absolute: 0 10*3/uL (ref 0.0–0.1)
Basophils Relative: 0 % (ref 0–1)
EOS PCT: 0 % (ref 0–5)
Eosinophils Absolute: 0 10*3/uL (ref 0.0–0.7)
HEMATOCRIT: 36.6 % (ref 36.0–46.0)
Hemoglobin: 12.8 g/dL (ref 12.0–15.0)
LYMPHS ABS: 1.4 10*3/uL (ref 0.7–4.0)
LYMPHS PCT: 19 % (ref 12–46)
MCH: 30.3 pg (ref 26.0–34.0)
MCHC: 35 g/dL (ref 30.0–36.0)
MCV: 86.7 fL (ref 78.0–100.0)
MONOS PCT: 8 % (ref 3–12)
Monocytes Absolute: 0.6 10*3/uL (ref 0.1–1.0)
NEUTROS ABS: 5 10*3/uL (ref 1.7–7.7)
Neutrophils Relative %: 73 % (ref 43–77)
Platelets: 254 10*3/uL (ref 150–400)
RBC: 4.22 MIL/uL (ref 3.87–5.11)
RDW: 14 % (ref 11.5–15.5)
WBC: 7 10*3/uL (ref 4.0–10.5)

## 2014-10-26 LAB — WET PREP, GENITAL
Clue Cells Wet Prep HPF POC: NONE SEEN
Trich, Wet Prep: NONE SEEN
Yeast Wet Prep HPF POC: NONE SEEN

## 2014-10-26 LAB — URINALYSIS, ROUTINE W REFLEX MICROSCOPIC
BILIRUBIN URINE: NEGATIVE
Glucose, UA: NEGATIVE mg/dL
Ketones, ur: NEGATIVE mg/dL
LEUKOCYTES UA: NEGATIVE
Nitrite: NEGATIVE
PH: 5.5 (ref 5.0–8.0)
Protein, ur: NEGATIVE mg/dL
Specific Gravity, Urine: 1.02 (ref 1.005–1.030)
UROBILINOGEN UA: 0.2 mg/dL (ref 0.0–1.0)

## 2014-10-26 LAB — URINE MICROSCOPIC-ADD ON

## 2014-10-26 LAB — POCT PREGNANCY, URINE: PREG TEST UR: POSITIVE — AB

## 2014-10-26 LAB — HCG, QUANTITATIVE, PREGNANCY: HCG, BETA CHAIN, QUANT, S: 68396 m[IU]/mL — AB (ref <5)

## 2014-10-26 NOTE — MAU Provider Note (Signed)
History     CSN: [id number]  Arrival date and time: 10/26/14 1035   First Provider Initiated Contact with Patient 10/26/14 1116      Chief Complaint  Patient presents with  . Vaginal Bleeding   HPI  [name] is a 34 y.o. G2P0010 at [redacted]w[redacted]d who presents to MAU today with complaint of vaginal bleeding since last night. She states LMP of 09/09/14 and recent +HPT. She states spotting is brown and started last night. She denies abdominal pain today, but did have some lower abdominal and back pain last night. She did not take anything for the pain. She also noted a thick, white vaginal discharge. She denies UTI symptoms or fever.   OB History    Gravida Para Term Preterm AB TAB SAB Ectopic Multiple Living   2    1 0 1 0        Past Medical History  Diagnosis Date  . Miscarriage   . Infection     UTI  . Ovarian cyst     Past Surgical History  Procedure Laterality Date  . No past surgeries      Family History  Problem Relation Age of Onset  . Hypertension Maternal Grandfather   . Cancer Maternal Grandfather     leukemia    History  Substance Use Topics  . Smoking status: Never Smoker   . Smokeless tobacco: Never Used  . Alcohol Use: No    Allergies: No Known Allergies  Prescriptions prior to admission  Medication Sig Dispense Refill Last Dose  . Prenatal Multivit-Min-Fe-FA (PRENATAL/IRON) TABS Take 1 tablet by mouth.   10/25/2014 at Unknown time  . acetaminophen (TYLENOL) 500 MG tablet Take 500 mg by mouth every 6 (six) hours as needed for moderate pain.   11/17/2013 at Unknown time    Review of Systems  Constitutional: Negative for fever and malaise/fatigue.  Gastrointestinal: Negative for nausea, vomiting, abdominal pain, diarrhea and constipation.  Genitourinary: Negative for dysuria, urgency and frequency.       + vaginal bleeding, discharge   Physical Exam   Blood pressure 123/83, pulse 86, temperature 99.1 F (37.3 C), temperature source  Oral, resp. rate 16, height  (1.448 m), weight 105 lb (47.628 kg), last menstrual period 09/09/2014, unknown if currently breastfeeding.  Physical Exam  Nursing note and vitals reviewed. Constitutional: She is oriented to person, place, and time. She appears well-developed and well-nourished. No distress.  HENT:  Head: Normocephalic and atraumatic.  Cardiovascular: Normal rate.   Respiratory: Effort normal.  GI: Soft. She exhibits no distension and no mass. There is no tenderness. There is no rebound and no guarding.  Genitourinary: Uterus is not enlarged and not tender. Cervix exhibits no motion tenderness, no discharge and no friability. Right adnexum displays no mass and no tenderness. Left adnexum displays tenderness (mild). Left adnexum displays no mass. There is bleeding (scant light brown) in the vagina. Vaginal discharge found.    Neurological: She is alert and oriented to person, place, and time.  Skin: Skin is warm and dry. No erythema.  Psychiatric: She has a normal mood and affect.    Results for orders placed or performed during the hospital encounter of 10/26/14 (from the past 24 hour(s))  Urinalysis, Routine w reflex microscopic (not at [facility])     Status: Abnormal   Collection Time: 10/26/14 10:45 AM  Result Value Ref Range   Color, Urine YELLOW YELLOW   APPearance CLEAR CLEAR   Specific Gravity,  Urine 1.020 1.005 - 1.030   pH 5.5 5.0 - 8.0   Glucose, UA NEGATIVE NEGATIVE mg/dL   Hgb urine dipstick SMALL (A) NEGATIVE   Bilirubin Urine NEGATIVE NEGATIVE   Ketones, ur NEGATIVE NEGATIVE mg/dL   Protein, ur NEGATIVE NEGATIVE mg/dL   Urobilinogen, UA 0.2 0.0 - 1.0 mg/dL   Nitrite NEGATIVE NEGATIVE   Leukocytes, UA NEGATIVE NEGATIVE  Urine microscopic-add on     Status: Abnormal   Collection Time: 10/26/14 10:45 AM  Result Value Ref Range   Squamous Epithelial / LPF FEW (A) RARE   WBC, UA 0-2 <3 WBC/hpf   RBC / HPF 0-2 <3 RBC/hpf  Pregnancy, urine POC     Status:  Abnormal   Collection Time: 10/26/14 10:50 AM  Result Value Ref Range   Preg Test, Ur POSITIVE (A) NEGATIVE  CBC with Differential/Platelet     Status: None   Collection Time: 10/26/14 11:15 AM  Result Value Ref Range   WBC 7.0 4.0 - 10.5 K/uL   RBC 4.22 3.87 - 5.11 MIL/uL   Hemoglobin 12.8 12.0 - 15.0 g/dL   HCT 16.136.6 09.636.0 - 04.546.0 %   MCV 86.7 78.0 - 100.0 fL   MCH 30.3 26.0 - 34.0 pg   MCHC 35.0 30.0 - 36.0 g/dL   RDW 40.914.0 81.111.5 - 91.415.5 %   Platelets 254 150 - 400 K/uL   Neutrophils Relative % 73 43 - 77 %   Neutro Abs 5.0 1.7 - 7.7 K/uL   Lymphocytes Relative 19 12 - 46 %   Lymphs Abs 1.4 0.7 - 4.0 K/uL   Monocytes Relative 8 3 - 12 %   Monocytes Absolute 0.6 0.1 - 1.0 K/uL   Eosinophils Relative 0 0 - 5 %   Eosinophils Absolute 0.0 0.0 - 0.7 K/uL   Basophils Relative 0 0 - 1 %   Basophils Absolute 0.0 0.0 - 0.1 K/uL  hCG, quantitative, pregnancy     Status: Abnormal   Collection Time: 10/26/14 11:15 AM  Result Value Ref Range   hCG, Beta Chain, Quant, S [id number] (H) <5 mIU/mL  Wet prep, genital     Status: Abnormal   Collection Time: 10/26/14 11:25 AM  Result Value Ref Range   Yeast Wet Prep HPF POC NONE SEEN NONE SEEN   Trich, Wet Prep NONE SEEN NONE SEEN   Clue Cells Wet Prep HPF POC NONE SEEN NONE SEEN   WBC, Wet Prep HPF POC MODERATE (A) NONE SEEN   [place] Ob Comp Less 14 Wks  10/26/2014   CLINICAL DATA:  Vaginal bleeding in early pregnancy. Patient is 6 weeks 5 days pregnant based on the last menstrual period. Beta HCG level is pending.  EXAM: OBSTETRIC <14 WK US AND TRANSVAGINAL OB US  TECHNIQUE: Both transabdominal and transvaginal ultrasound examinations were performed for complete evaluation of the gestation as well as the maternal uterus, adnexal regions, and pelvic cul-de-sac. Transvaginal technique was performed to assess early pregnancy.  COMPARISON:  None.  FINDINGS: Intrauterine gestational sac: Visualized/normal in shape.  Yolk sac:  Yes  Embryo:  Tiny embryo  visualized.  Cardiac Activity: Yes  Heart Rate: Embryo too small to detect heart rate by M-mode Doppler analysis  MSD: 13.2  mm   6 w   1  d  CRL:  2.9  mm   6 w   0 d                  US EDC: 06/20/2015  Maternal uterus/adnexae: No uterine mass. No subchorionic hemorrhage. No endometrial fluid. Cervix is unremarkable. Normal ovaries. Trace pelvic free fluid.  IMPRESSION: 1. Findings consistent with an early intrauterine pregnancy. This is dated at 6 weeks, concordant with the patient's gestational stage based on the last menstrual period. There is no emergent pregnancy complication. 2. No emergent maternal abnormality.  Normal ovaries and adnexa.   Electronically Signed   By: Amie Portland M.D.   On: 10/26/2014 12:21   US Ob Transvaginal  10/26/2014   CLINICAL DATA:  Vaginal bleeding in early pregnancy. Patient is 6 weeks 5 days pregnant based on the last menstrual period. Beta HCG level is pending.  EXAM: OBSTETRIC <14 WK [place] AND TRANSVAGINAL OB US  TECHNIQUE: Both transabdominal and transvaginal ultrasound examinations were performed for complete evaluation of the gestation as well as the maternal uterus, adnexal regions, and pelvic cul-de-sac. Transvaginal technique was performed to assess early pregnancy.  COMPARISON:  None.  FINDINGS: Intrauterine gestational sac: Visualized/normal in shape.  Yolk sac:  Yes  Embryo:  Tiny embryo visualized.  Cardiac Activity: Yes  Heart Rate: Embryo too small to detect heart rate by M-mode Doppler analysis  MSD: 13.2  mm   6 w   1  d  CRL:  2.9  mm   6 w   0 d                  [place] EDC: 06/20/2015  Maternal uterus/adnexae: No uterine mass. No subchorionic hemorrhage. No endometrial fluid. Cervix is unremarkable. Normal ovaries. Trace pelvic free fluid.  IMPRESSION: 1. Findings consistent with an early intrauterine pregnancy. This is dated at 6 weeks, concordant with the patient's gestational stage based on the last menstrual period. There is no emergent pregnancy complication.  2. No emergent maternal abnormality.  Normal ovaries and adnexa.   Electronically Signed   By: Amie Portland M.D.   On: 10/26/2014 12:21    MAU Course  Procedures None  MDM +UPT UA, wet prep, GC/chlamydia, CBC, quant hCG, HIV, RPR and [place] today to rule out ectopic pregnancy GC/Chlamydia, HIV and RPR pending at time of discharge Assessment and Plan  A: SIUP at [redacted]w[redacted]d Cervical polyp Vaginal bleeding in pregnancy prior to [redacted] weeks gestation  P: Discharge home Patient advised to start taking prenatal vitamins First trimester precautions discussed Patient advised to follow-up with OB provider of choice. List of area providers given with pregnancy confirmation lettter Patient may return to MAU as needed or if her condition were to change or worsen   Marny Lowenstein, PA-C  10/26/2014, 12:31 PM

## 2014-10-26 NOTE — MAU Note (Signed)
Pt reports after she was seen here and dx with miscarriage,  4 days later, she passed the fetus.   Went to a clinic, they gave her medicine and took care of it.

## 2014-10-26 NOTE — Discharge Instructions (Signed)
Primer trimestre de embarazo (First Trimester of Pregnancy) El primer trimestre de embarazo se extiende desde la semana1 hasta el final de la semana12 (mes1 al mes3). Durante este tiempo, el beb comenzar a desarrollarse dentro suyo. Entre la semana6 y la8, se forman los ojos y el rostro, y los latidos del corazn pueden verse en la ecografa. Al final de las 12semanas, todos los rganos del beb estn formados. La atencin prenatal es toda la asistencia mdica que usted recibe antes del nacimiento del beb. Asegrese de recibir una buena atencin prenatal y de seguir todas las indicaciones del mdico. CUIDADOS EN EL HOGAR  Medicamentos:  Tome los medicamentos solamente como se lo haya indicado el mdico. Algunos medicamentos se pueden tomar durante el embarazo y otros no.  Tome las vitaminas prenatales como se lo haya indicado el mdico.  Tome el medicamento que la ayuda a defecar (laxante suave) segn sea necesario, si el mdico lo autoriza. Dieta  Ingiera alimentos saludables de manera regular.  El mdico le indicar la cantidad de peso que puede aumentar.  No coma carne cruda ni quesos sin cocinar.  Si tiene malestar estomacal (nuseas) o vomita:  Ingiera 4 o 5comidas pequeas por da en lugar de 3abundantes.  Intente comer algunas galletitas saladas.  Beba lquidos entre las comidas, en lugar de hacerlo durante estas.  Si tiene dificultad para defecar (estreimiento):  Consuma alimentos con alto contenido de fibra, como verduras y frutas frescos, y cereales integrales.  Beba suficiente lquido para mantener el pis (orina) claro o de color amarillo plido. Actividad y ejercicios  Haga ejercicios solamente como se lo haya indicado el mdico. Deje de hacer ejercicios si tiene clicos o dolor en la parte baja del vientre (abdomen) o en la cintura.  Intente no estar de pie durante mucho tiempo. Mueva las piernas con frecuencia si debe estar de pie en un lugar durante  mucho tiempo.  Evite levantar pesos excesivos.  Use zapatos con tacones bajos. Mantenga una buena postura al sentarse y pararse.  Puede tener relaciones sexuales, a menos que el mdico le indique lo contrario. Alivio del dolor o las molestias  Use un sostn que le brinde buen soporte si le duelen las mamas.  Dese baos con agua tibia (baos de asiento) para aliviar el dolor o las molestias a causa de las hemorroides. Use crema antihemorroidal si el mdico se lo permite.  Descanse con las piernas elevadas si tiene calambres o dolor de cintura.  Use medias de descanso si tiene las venas de las piernas hinchadas y abultadas (venas varicosas). Eleve los pies durante 15minutos, 3 o 4veces por da. Limite la cantidad de sal en su dieta. Cuidados prenatales  Programe las visitas prenatales para la semana12 de embarazo.  Escriba sus preguntas. Llvelas cuando concurra a las visitas prenatales.  Concurra a todas las visitas prenatales como se lo haya indicado el mdico. Seguridad  Colquese el cinturn de seguridad cuando conduzca.  Haga una lista con los nmeros de telfono en caso de emergencia, en la cual deben incluirse los nmeros de los familiares, los amigos, el hospital y los departamentos de polica y de bomberos. Consejos generales  Pdale al mdico que la derive a clases prenatales en su localidad. Debe comenzar a tomar las clases antes de entrar en el mes6 de embarazo.  Pida ayuda si necesita asesoramiento o asistencia con la alimentacin. El mdico puede aconsejarla o indicarle dnde recurrir para recibir ayuda.  No se d baos de inmersin en agua caliente,   baos turcos ni saunas.  No se haga duchas vaginales ni use tampones o toallas higinicas perfumadas.  No mantenga las piernas cruzadas durante mucho tiempo.  Evite el contacto con las bandejas sanitarias de los gatos y la tierra que estos animales usan.  No fume, no consuma hierbas ni beba alcohol. No tome  frmacos que el mdico no haya autorizado.  Visite al dentista. En su casa, lvese los dientes con un cepillo dental suave. Psese el hilo dental con suavidad. SOLICITE AYUDA SI:  Tiene mareos.  Tiene clicos leves o siente presin en la parte baja del vientre.  Siente un dolor persistente en la zona del vientre.  Sigue teniendo malestar estomacal, vomita o las heces son lquidas (diarrea).  Observa una secrecin, con mal olor que proviene de la vagina.  Siente dolor al orinar.  Tiene el rostro, las manos, las piernas o los tobillos ms hinchados (inflamados). SOLICITE AYUDA DE INMEDIATO SI:   Tiene fiebre.  Tiene una prdida de lquido por la vagina.  Tiene sangrado o pequeas prdidas vaginales.  Tiene clicos o dolor muy intensos en el vientre.  Sube o baja de peso rpidamente.  Vomita sangre. Puede ser similar a la borra del caf  Est en contacto con personas que tienen rubola, la quinta enfermedad o varicela.  Siente un dolor de cabeza muy intenso.  Le falta el aire.  Sufre cualquier tipo de traumatismo, por ejemplo, debido a una cada o un accidente automovilstico. Document Released: 08/13/2008 Document Revised: 10/01/2013 ExitCare Patient Information 2015 [employer]. This information is not intended to replace advice given to you by your health care provider. Make sure you discuss any questions you have with your health care provider.  

## 2014-10-26 NOTE — MAU Note (Addendum)
Started spotting last night, dark brown.  Cramping about 10days ago. Not now.  +HPT May 17, confirmed at a clinic

## 2014-10-27 LAB — HIV ANTIBODY (ROUTINE TESTING W REFLEX): HIV SCREEN 4TH GENERATION: NONREACTIVE

## 2014-10-27 LAB — RPR: RPR: NONREACTIVE

## 2014-10-29 LAB — GC/CHLAMYDIA PROBE AMP (~~LOC~~) NOT AT ARMC
CHLAMYDIA, DNA PROBE: NEGATIVE
Neisseria Gonorrhea: NEGATIVE

## 2014-12-26 LAB — OB RESULTS CONSOLE ABO/RH: RH TYPE: POSITIVE

## 2014-12-26 LAB — OB RESULTS CONSOLE HEPATITIS B SURFACE ANTIGEN: HEP B S AG: NEGATIVE

## 2014-12-26 LAB — OB RESULTS CONSOLE GC/CHLAMYDIA: Gonorrhea: NEGATIVE

## 2014-12-26 LAB — OB RESULTS CONSOLE ANTIBODY SCREEN: Antibody Screen: NEGATIVE

## 2014-12-26 LAB — OB RESULTS CONSOLE RUBELLA ANTIBODY, IGM: RUBELLA: IMMUNE

## 2014-12-26 LAB — OB RESULTS CONSOLE HIV ANTIBODY (ROUTINE TESTING): HIV: NONREACTIVE

## 2015-01-27 ENCOUNTER — Other Ambulatory Visit (HOSPITAL_COMMUNITY): Payer: Self-pay | Admitting: Obstetrics & Gynecology

## 2015-01-27 DIAGNOSIS — O280 Abnormal hematological finding on antenatal screening of mother: Secondary | ICD-10-CM

## 2015-01-27 DIAGNOSIS — Z3A20 20 weeks gestation of pregnancy: Secondary | ICD-10-CM

## 2015-01-27 DIAGNOSIS — Z3689 Encounter for other specified antenatal screening: Secondary | ICD-10-CM

## 2015-01-31 ENCOUNTER — Ambulatory Visit (HOSPITAL_COMMUNITY)
Admission: RE | Admit: 2015-01-31 | Discharge: 2015-01-31 | Disposition: A | Discharge status: Home / Self Care | Payer: Medicaid Other | Source: Ambulatory Visit | Attending: Obstetrics & Gynecology | Admitting: Obstetrics & Gynecology

## 2015-01-31 ENCOUNTER — Ambulatory Visit (HOSPITAL_COMMUNITY): Payer: Medicaid Other

## 2015-01-31 ENCOUNTER — Encounter (HOSPITAL_COMMUNITY): Payer: Self-pay

## 2015-01-31 ENCOUNTER — Ambulatory Visit (HOSPITAL_COMMUNITY): Admission: RE | Admit: 2015-01-31 | Payer: Medicaid Other | Source: Ambulatory Visit

## 2015-01-31 DIAGNOSIS — Z3689 Encounter for other specified antenatal screening: Secondary | ICD-10-CM

## 2015-01-31 DIAGNOSIS — Z3A20 20 weeks gestation of pregnancy: Secondary | ICD-10-CM | POA: Insufficient documentation

## 2015-01-31 DIAGNOSIS — Z36 Encounter for antenatal screening of mother: Secondary | ICD-10-CM | POA: Insufficient documentation

## 2015-01-31 DIAGNOSIS — O289 Unspecified abnormal findings on antenatal screening of mother: Secondary | ICD-10-CM | POA: Insufficient documentation

## 2015-01-31 DIAGNOSIS — O280 Abnormal hematological finding on antenatal screening of mother: Secondary | ICD-10-CM

## 2015-02-10 ENCOUNTER — Other Ambulatory Visit (HOSPITAL_COMMUNITY): Payer: Self-pay | Admitting: Obstetrics & Gynecology

## 2015-03-10 ENCOUNTER — Encounter (HOSPITAL_COMMUNITY): Payer: Self-pay | Admitting: *Deleted

## 2015-03-10 ENCOUNTER — Inpatient Hospital Stay (HOSPITAL_COMMUNITY)
Admission: AD | Admit: 2015-03-10 | Discharge: 2015-03-10 | Disposition: A | Discharge status: Home / Self Care | Payer: Medicaid Other | Source: Ambulatory Visit | Attending: Obstetrics and Gynecology | Admitting: Obstetrics and Gynecology

## 2015-03-10 DIAGNOSIS — Z789 Other specified health status: Secondary | ICD-10-CM

## 2015-03-10 DIAGNOSIS — E860 Dehydration: Secondary | ICD-10-CM | POA: Insufficient documentation

## 2015-03-10 DIAGNOSIS — O09529 Supervision of elderly multigravida, unspecified trimester: Secondary | ICD-10-CM

## 2015-03-10 DIAGNOSIS — R109 Unspecified abdominal pain: Secondary | ICD-10-CM | POA: Insufficient documentation

## 2015-03-10 DIAGNOSIS — R55 Syncope and collapse: Secondary | ICD-10-CM | POA: Insufficient documentation

## 2015-03-10 DIAGNOSIS — Z3A26 26 weeks gestation of pregnancy: Secondary | ICD-10-CM | POA: Insufficient documentation

## 2015-03-10 DIAGNOSIS — R6252 Short stature (child): Secondary | ICD-10-CM

## 2015-03-10 DIAGNOSIS — O289 Unspecified abnormal findings on antenatal screening of mother: Secondary | ICD-10-CM

## 2015-03-10 DIAGNOSIS — O26892 Other specified pregnancy related conditions, second trimester: Secondary | ICD-10-CM | POA: Insufficient documentation

## 2015-03-10 DIAGNOSIS — O09522 Supervision of elderly multigravida, second trimester: Secondary | ICD-10-CM | POA: Diagnosis not present

## 2015-03-10 HISTORY — DX: Anemia, unspecified: D64.9

## 2015-03-10 LAB — URINE MICROSCOPIC-ADD ON

## 2015-03-10 LAB — WET PREP, GENITAL
Clue Cells Wet Prep HPF POC: NONE SEEN
Trich, Wet Prep: NONE SEEN
Yeast Wet Prep HPF POC: NONE SEEN

## 2015-03-10 LAB — URINALYSIS, ROUTINE W REFLEX MICROSCOPIC
BILIRUBIN URINE: NEGATIVE
Glucose, UA: NEGATIVE mg/dL
Hgb urine dipstick: NEGATIVE
Ketones, ur: 15 mg/dL — AB
Nitrite: NEGATIVE
PROTEIN: NEGATIVE mg/dL
Specific Gravity, Urine: 1.02 (ref 1.005–1.030)
UROBILINOGEN UA: 0.2 mg/dL (ref 0.0–1.0)
pH: 6 (ref 5.0–8.0)

## 2015-03-10 NOTE — Discharge Instructions (Signed)
Deshidratacin en los adultos (Dehydration, Adult) La deshidratacin significa que el organismo no tiene todo el lquido o el agua que necesita. Se produce cuando se toma menos lquido del que se pierde. Los riones, el cerebro y el corazn no funcionarn correctamente sin la cantidad [place] de lquido.  La deshidratacin puede ser leve o grave. Debe tratarse de inmediato para evitar que se agrave. CUIDADOS EN EL HOGAR  Beba suficiente lquido para mantener el pis (orina) claro o de color amarillo plido.  Beba lentamente pequeos sorbos de agua o de lquido. Tambin puede chupar cubos de hielo.  Consuma alimentos o bebidas que contengan electrolitos. Como por ejemplo, bananas y bebidas deportivas.  Tome los medicamentos de venta libre y los recetados solamente como se lo haya indicado el mdico.  Prepare la solucin de rehidratacin oral (SRO) de acuerdo con las indicaciones del producto. Tome sorbos de la SRO cada hasta que la orina se normalice.  Si vomita o la materia fecal es lquida (diarrea), siga intentando tomar agua, SRO o las [employer].  Si la materia fecal es lquida, evite lo siguiente:  Las bebidas con cafena.  El jugo de frutas.  [employer].  Las bebidas gaseosas.  No tome comprimidos de sal. Esto puede aumentar la concentracin de sodio en el organismo (hipernatremia). SOLICITE AYUDA SI:  No puede comer o tomar lquido sin vomitar.  La materia fecal ha sido levemente acuosa durante ms de 24horas.  Tiene fiebre. SOLICITE AYUDA DE INMEDIATO SI:   Tiene mucha sed.  La materia fecal es [place].  No ha orinado durante 6 a 8horas o solo ha orinado una cantidad [employer].  Tiene la piel arrugada.  Est mareado, confundido o tiene ambos sntomas.   Esta informacin no tiene Theme park manager el consejo del mdico. Asegrese de hacerle al mdico cualquier pregunta que tenga.   Document Released: 06/19/2010 Document Revised:  02/05/2015 Elsevier Interactive Patient Education [employer]

## 2015-03-10 NOTE — MAU Note (Signed)
Pt reports she passed out at 1900 , states she just sat down in a chair and didn't injure herself.

## 2015-03-10 NOTE — MAU Provider Note (Signed)
History  34 yo G2P0010 @ 26.0 wks presents to MAU unannounced w/ c/o fainting episode x 1 at 7 pm this evening, and low abdominal pain. Spanish interpreter in room providing translation. +FM. Denies VB, LOF or back pain. Dietary recall obtained and includes pizza, tacos, 1 1/2 bottles of water and sprite. Last ate at 6 pm. Last BM today and normal.  Reports feeling much better once settled in MAU bed.  Patient Active Problem List   Diagnosis Date Noted  . Language barrier 03/10/2015  . Syncope 03/10/2015  . AMA (advanced maternal age) multigravida 35+ 03/10/2015  . Abnormal findings on antenatal screening of mother -- DS risk 1/53; MFM consult; growth scan monthly; pt declined further testing 03/10/2015  . SS (short stature) 03/10/2015    No chief complaint on file.  HPI As above OB History    Gravida Para Term Preterm AB TAB SAB Ectopic Multiple Living   2    1 0 1 0        Past Medical History  Diagnosis Date  . Miscarriage   . Infection     UTI  . Ovarian cyst   . Anemia     Past Surgical History  Procedure Laterality Date  . No past surgeries      Family History  Problem Relation Age of Onset  . Hypertension Maternal Grandfather   . Cancer Maternal Grandfather     leukemia    Social History  Substance Use Topics  . Smoking status: Never Smoker   . Smokeless tobacco: Never Used  . Alcohol Use: No    Allergies: No Known Allergies  No prescriptions prior to admission    ROS  Fainting episode +FM -VB Low abdominal pain -LOF Physical Exam   Recent Results (from the past 2160 hour(s))  GC/Chlamydia probe amp ([place])not at [facility]     Status: None   Collection Time: 03/10/15 12:00 AM  Result Value Ref Range   Chlamydia Negative     Comment: Normal Reference Range - Negative   Neisseria gonorrhea Negative     Comment: Normal Reference Range - Negative  Urinalysis, Routine w reflex microscopic (not at [facility])     Status: Abnormal   Collection  Time: 03/10/15  8:35 PM  Result Value Ref Range   Color, Urine YELLOW YELLOW   APPearance CLEAR CLEAR   Specific Gravity, Urine 1.020 1.005 - 1.030   pH 6.0 5.0 - 8.0   Glucose, UA NEGATIVE NEGATIVE mg/dL   Hgb urine dipstick NEGATIVE NEGATIVE   Bilirubin Urine NEGATIVE NEGATIVE   Ketones, ur 15 (A) NEGATIVE mg/dL   Protein, ur NEGATIVE NEGATIVE mg/dL   Urobilinogen, UA 0.2 0.0 - 1.0 mg/dL   Nitrite NEGATIVE NEGATIVE   Leukocytes, UA SMALL (A) NEGATIVE  Urine microscopic-add on     Status: Abnormal   Collection Time: 03/10/15  8:35 PM  Result Value Ref Range   Squamous Epithelial / LPF MANY (A) RARE   WBC, UA 3-6 <3 WBC/hpf   RBC / HPF 0-2 <3 RBC/hpf   Bacteria, UA RARE RARE   Urine-Other AMORPHOUS URATES/PHOSPHATES   Wet prep, genital     Status: Abnormal   Collection Time: 03/10/15 10:47 PM  Result Value Ref Range   Yeast Wet Prep HPF POC NONE SEEN NONE SEEN   Trich, Wet Prep NONE SEEN NONE SEEN   Clue Cells Wet Prep HPF POC NONE SEEN NONE SEEN   WBC, Wet Prep HPF POC FEW (A) NONE  SEEN    Comment: FEW BACTERIA SEEN     Blood pressure 115/67, pulse 78, temperature 98.2 F (36.8 C), temperature source Oral, resp. rate 16, last menstrual period 09/09/2014, SpO2 100 %, unknown if currently breastfeeding.    Physical Exam Gen: NAD Lungs: CTAB CV: RRR w/o M/R/G Abdomen: gravid, soft, NT, no guarding or rebound  SSE: scant amount of white discharge noted, no odor. Sample collected for wet prep and GC/CT. Cvx visually closed FHR: BL 150 w/ moderate variability, +accels, no decels No ctxs per toco or palpation  ED Course  Assessment: Syncopal episode likely from dehydration and/or getting up too fast Reassuring FHRT Language barrier  Plan: Strict PTL precautions Rest/hydration, no sudden movements Will call pt only if wet prep or GC/CT positive OB f/u as previously scheduled, sooner if indicated   Sherre Scarlet CNM, MS 03/10/15, 10:50 PM

## 2015-03-11 LAB — GC/CHLAMYDIA PROBE AMP (~~LOC~~) NOT AT ARMC
Chlamydia: NEGATIVE
NEISSERIA GONORRHEA: NEGATIVE

## 2015-03-18 LAB — OB RESULTS CONSOLE RPR: RPR: NONREACTIVE

## 2015-05-20 LAB — OB RESULTS CONSOLE GBS: STREP GROUP B AG: NEGATIVE

## 2015-05-30 ENCOUNTER — Other Ambulatory Visit: Payer: Self-pay | Admitting: Obstetrics & Gynecology

## 2015-06-06 ENCOUNTER — Inpatient Hospital Stay (HOSPITAL_COMMUNITY)
Admission: RE | Admit: 2015-06-06 | Discharge: 2015-06-06 | Disposition: A | Discharge status: Home / Self Care | Payer: Medicaid Other | Source: Ambulatory Visit

## 2015-06-09 ENCOUNTER — Inpatient Hospital Stay (HOSPITAL_COMMUNITY)
Admission: RE | Admit: 2015-06-09 | Payer: Medicaid Other | Source: Ambulatory Visit | Admitting: Obstetrics & Gynecology

## 2015-06-09 ENCOUNTER — Encounter (HOSPITAL_COMMUNITY): Payer: Self-pay | Admitting: *Deleted

## 2015-06-09 ENCOUNTER — Encounter (HOSPITAL_COMMUNITY): Admission: RE | Payer: Self-pay | Source: Ambulatory Visit

## 2015-06-09 ENCOUNTER — Inpatient Hospital Stay (HOSPITAL_COMMUNITY)
Admission: AD | Admit: 2015-06-09 | Discharge: 2015-06-14 | DRG: 766 | Disposition: A | Discharge status: Home / Self Care | Payer: Medicaid Other | Source: Ambulatory Visit | Attending: Obstetrics & Gynecology | Admitting: Obstetrics & Gynecology

## 2015-06-09 DIAGNOSIS — Z98891 History of uterine scar from previous surgery: Secondary | ICD-10-CM

## 2015-06-09 DIAGNOSIS — O9081 Anemia of the puerperium: Secondary | ICD-10-CM | POA: Diagnosis not present

## 2015-06-09 DIAGNOSIS — D649 Anemia, unspecified: Secondary | ICD-10-CM | POA: Diagnosis not present

## 2015-06-09 DIAGNOSIS — O1410 Severe pre-eclampsia, unspecified trimester: Secondary | ICD-10-CM

## 2015-06-09 DIAGNOSIS — R000 Tachycardia, unspecified: Secondary | ICD-10-CM | POA: Diagnosis present

## 2015-06-09 DIAGNOSIS — O1420 HELLP syndrome (HELLP), unspecified trimester: Secondary | ICD-10-CM

## 2015-06-09 DIAGNOSIS — O99284 Endocrine, nutritional and metabolic diseases complicating childbirth: Secondary | ICD-10-CM | POA: Diagnosis present

## 2015-06-09 DIAGNOSIS — Z8249 Family history of ischemic heart disease and other diseases of the circulatory system: Secondary | ICD-10-CM | POA: Diagnosis not present

## 2015-06-09 DIAGNOSIS — Z6833 Body mass index (BMI) 33.0-33.9, adult: Secondary | ICD-10-CM | POA: Diagnosis not present

## 2015-06-09 DIAGNOSIS — O09529 Supervision of elderly multigravida, unspecified trimester: Secondary | ICD-10-CM

## 2015-06-09 DIAGNOSIS — R6252 Short stature (child): Secondary | ICD-10-CM

## 2015-06-09 DIAGNOSIS — O289 Unspecified abnormal findings on antenatal screening of mother: Secondary | ICD-10-CM | POA: Diagnosis present

## 2015-06-09 DIAGNOSIS — Z3A39 39 weeks gestation of pregnancy: Secondary | ICD-10-CM

## 2015-06-09 DIAGNOSIS — Z789 Other specified health status: Secondary | ICD-10-CM | POA: Diagnosis present

## 2015-06-09 DIAGNOSIS — O1424 HELLP syndrome, complicating childbirth: Principal | ICD-10-CM | POA: Diagnosis present

## 2015-06-09 DIAGNOSIS — E039 Hypothyroidism, unspecified: Secondary | ICD-10-CM | POA: Diagnosis present

## 2015-06-09 LAB — CBC
HEMATOCRIT: 37.4 % (ref 36.0–46.0)
HEMOGLOBIN: 13.1 g/dL (ref 12.0–15.0)
MCH: 32.4 pg (ref 26.0–34.0)
MCHC: 35 g/dL (ref 30.0–36.0)
MCV: 92.6 fL (ref 78.0–100.0)
Platelets: 152 10*3/uL (ref 150–400)
RBC: 4.04 MIL/uL (ref 3.87–5.11)
RDW: 13.7 % (ref 11.5–15.5)
WBC: 8 10*3/uL (ref 4.0–10.5)

## 2015-06-09 LAB — TYPE AND SCREEN
ABO/RH(D): A POS
ANTIBODY SCREEN: NEGATIVE

## 2015-06-09 LAB — POCT FERN TEST: POCT Fern Test: POSITIVE

## 2015-06-09 SURGERY — Surgical Case
Anesthesia: Regional

## 2015-06-09 MED ORDER — LIDOCAINE HCL (PF) 1 % IJ SOLN: 30.0000 mL | INTRAMUSCULAR | Status: DC | PRN

## 2015-06-09 MED ORDER — OXYTOCIN BOLUS FROM INFUSION: 500.0000 mL | INTRAVENOUS | Status: DC

## 2015-06-09 MED ORDER — LABETALOL HCL 5 MG/ML IV SOLN
20.0000 mg | INTRAVENOUS | Status: AC | PRN
  Administered 2015-06-10 (×2): 20 mg via INTRAVENOUS
  Administered 2015-06-10: 40 mg via INTRAVENOUS
  Filled 2015-06-09: qty 16
  Filled 2015-06-09: qty 8
  Filled 2015-06-09: qty 4

## 2015-06-09 MED ORDER — CITRIC ACID-SODIUM CITRATE 334-500 MG/5ML PO SOLN
30.0000 mL | ORAL | Status: DC | PRN
  Administered 2015-06-10: 30 mL via ORAL
  Filled 2015-06-09: qty 15

## 2015-06-09 MED ORDER — ONDANSETRON HCL 4 MG/2ML IJ SOLN: 4.0000 mg | Freq: Four times a day (QID) | INTRAMUSCULAR | Status: DC | PRN

## 2015-06-09 MED ORDER — OXYTOCIN 10 UNIT/ML IJ SOLN: 2.5000 [IU]/h | INTRAVENOUS | Status: DC

## 2015-06-09 MED ORDER — LACTATED RINGERS IV SOLN
INTRAVENOUS | Status: DC
  Administered 2015-06-10: 125 mL/h via INTRAVENOUS
  Administered 2015-06-10: via INTRAVENOUS

## 2015-06-09 MED ORDER — ACETAMINOPHEN 325 MG PO TABS: 650.0000 mg | ORAL_TABLET | ORAL | Status: DC | PRN

## 2015-06-09 MED ORDER — OXYTOCIN 10 UNIT/ML IJ SOLN
1.0000 m[IU]/min | INTRAMUSCULAR | Status: DC
  Administered 2015-06-10: 1 m[IU]/min via INTRAVENOUS
  Filled 2015-06-09: qty 4

## 2015-06-09 MED ORDER — OXYCODONE-ACETAMINOPHEN 5-325 MG PO TABS: 1.0000 | ORAL_TABLET | ORAL | Status: DC | PRN

## 2015-06-09 MED ORDER — LACTATED RINGERS IV SOLN
500.0000 mL | INTRAVENOUS | Status: DC | PRN
  Administered 2015-06-10: 500 mL via INTRAVENOUS

## 2015-06-09 MED ORDER — TERBUTALINE SULFATE 1 MG/ML IJ SOLN: 0.2500 mg | Freq: Once | INTRAMUSCULAR | Status: DC | PRN

## 2015-06-09 MED ORDER — HYDRALAZINE HCL 20 MG/ML IJ SOLN
10.0000 mg | Freq: Once | INTRAMUSCULAR | Status: AC | PRN
  Administered 2015-06-10: 10 mg via INTRAVENOUS
  Filled 2015-06-09: qty 1

## 2015-06-09 MED ORDER — OXYCODONE-ACETAMINOPHEN 5-325 MG PO TABS: 2.0000 | ORAL_TABLET | ORAL | Status: DC | PRN

## 2015-06-09 MED ORDER — FLEET ENEMA 7-19 GM/118ML RE ENEM: 1.0000 | ENEMA | RECTAL | Status: DC | PRN

## 2015-06-09 NOTE — H&P (Signed)
Kristina PoissonRosa Gordon is a 35 y.o. female, G2P0010 at 39.0 weeks, presenting for spontaneous rupture of membranes at 1800 and occasional contractions. +Fm, denies bleeding.    -Language barrier: speaks spanish, limited english. Husband speaks english and often acts as Nurse, learning disabilitytranslator.  - AFP indicated elevated risk for Trisomy 21, MFM US normal with no noted marker for aneuploidy.  Genetic counseling and further  testing for aneuploidy declined.   Patient Active Problem List   Diagnosis Date Noted  . Vaginal delivery 06/09/2015  . Language barrier 03/10/2015  . Syncope 03/10/2015  . AMA (advanced maternal age) multigravida 35+ 03/10/2015  . Abnormal findings on antenatal screening of mother -- DS risk 1/53; MFM consult; growth scan monthly; pt declined further testing 03/10/2015  . SS (short stature) 03/10/2015    History of present pregnancy: Patient entered care at 14.6 weeks.   EDC of 06/16/15  was established by LMP and early US at 6.1 wks   Anatomy scan:  20.4  weeks, with normal findings and an right lateral placenta.   Normal detailed anatomy, normal fluid, no markers of aneiplody seen,  Additional US evaluations:   6.5  wks Vaginal bleeding (10/26/14):   Early IUP, Dating 6wks, no emergent maternal abnormality  27.1wks  US Growth (03/18/15:  EFW 972g, 2lb 2oz, 43%, Fetus in oblique position, head maternal left, feet near  cervix, placenta is anterior, right,  Cervix apprears closed, no previa seen, AFI 21.5cm, 80% 30.1 wks   Growth US  (04/08/15):   EFW 1475g, 3lb 4oz, 49%, transverse head maternal presentation. Cervix closed,  measured transabdonially 4.9cm, Fundal placenta 34.1 wks Growth US (05/06/15):   2372g, 5lb 4oz, 61%, transverse HML presentation, cervix closed, - measured  transabdominally 4.2cm, Fundal placenta. Fluid is normal, AFI=60%, FL=11% 39.2 wks  Presentation  06/04/15:  Vertex presenation, lateral right placenta, fluid is normal - AFI =60%.   Significant prenatal events:   First Trimester:   Common discomforts of pregnancy : pelvic pain,Acid reflux, headaches,  Second Trimester:   Seen in  MAU at 26 wks for Syncope due to dehydration.  AFP indicated elevated risk for Trisomy 21, MFM US normal with no noted marker for aneuploidy.  Genetic counseling and further  testing for aneuploidy declined.   Note: femur length at 27 weeks found lagging on US.  Third Trimester:   Transverse lie, was originally scheduled for a Primary C/s due surgery was cancelled when fetal position changed to vertex at 39wks.   Last evaluation:  06/04/15  38.2wks,   FH 36wks,  E. Kulwa,MD     FT 152 bpm,   Presentation Cephalic,  +FM,  BP 120/80, wgt 125.5 lbs,   OB History    Gravida Para Term Preterm AB TAB SAB Ectopic Multiple Living   2    1 0 1 0       Past Medical History  Diagnosis Date  . Miscarriage   . Infection     UTI  . Ovarian cyst   . Anemia    Past Surgical History  Procedure Laterality Date  . No past surgeries     Family History: family history includes Cancer in her maternal grandfather; Hypertension in her maternal grandfather. Social History:  reports that she has never smoked. She has never used smokeless tobacco. She reports that she does not drink alcohol or use illicit drugs.  Latino heritage, Country of origin is [place], speaks Spanish wth limited english. Is of the Catholic faith and is married. Husband  acts as Nurse, learning disability. Graduated High school, works as a Futures trader.   Prenatal Transfer Tool  Maternal Diabetes: No Genetic Screening: Normal Maternal Ultrasounds/Referrals: Normal Fetal Ultrasounds or other Referrals:  None Maternal Substance Abuse:  No Significant Maternal Medications:  Meds include: Other:    PNV, TUM, tylenol Significant Maternal Lab Results: Lab values include: Group B Strep negative  TDAP 05/06/15 Flu 03/18/15  ROS:  Cntx. +FM, +LOF, VB   No Known Allergies   Dilation: 2 Effacement (%): 80 Station: -3 Exam by:: Kristina Gordon          CNM Blood pressure 150/91, pulse 79, temperature 97.9 F (36.6 C), temperature source Oral, resp. rate 18, height 4\' 5"  (1.346 m), weight 61.236 kg (135 lb), last menstrual period 09/09/2014, unknown if currently breastfeeding.  Chest clear Heart RRR without murmur Abd gravid, NT, FH 36 cm on 06/04/15 Ext: wnl   FHR: Category 1, 140 bpm,  Moderate variability,  +accels, no decels UCs:  irregular 2-7 min  Prenatal labs: ABO, Rh: --/--/A POS (01/09 2028) Antibody: NEG (01/09 2028) Rubella:  !Error! RPR: Nonreactive (10/18 0000)  HBsAg: Negative (07/28 0000)  HIV: Non-reactive (07/28 0000)  GBS: Negative (12/20 0000)  Negative 05/20/15 Sickle cell/Hgb electrophoresis:  AA% Pap:  Wnl,  01/09/15 GC:  Negative,   12/26/14, 05/20/15 Chlamydia:  Negative,   12/26/14, 05/20/15 Genetic screenings: Positive  AFP -  Down Syndrome risk, 1:53, greater than screen cut-off Glucola: wnl Other:   Hgb 13.0 at NOB, 11.7 at 28 weeks    Assessment/Plan: IUP at 39.0 SROM  At 1800- clear Vertex by [place] - confirmed by Kristina Gordon Elevated BP  AMA Language barrier- speaks spanish Abnormal genetic screen- increased risk for trisomy 21 (1:53)   Plan: Admit to Birthing Suite per consult with Kristina Gordon Routine [facility] orders Pain med/epidural prn PIH labos Pre-eclampsia focus order set Discussed  R/B augmentation of labor via translator Recommended pitocin - Pt agreeable Begin Pitocin 1x2 millinunits/min   Kristina Gordon, [place] 06/09/2015, 11:14 PM

## 2015-06-09 NOTE — MAU Note (Signed)
Pt C/O gush of clear fluid @ 1800, having occasional uc's, denies bleeding.

## 2015-06-10 ENCOUNTER — Encounter (HOSPITAL_COMMUNITY): Payer: Self-pay

## 2015-06-10 ENCOUNTER — Inpatient Hospital Stay (HOSPITAL_COMMUNITY): Payer: Medicaid Other | Admitting: Anesthesiology

## 2015-06-10 ENCOUNTER — Encounter (HOSPITAL_COMMUNITY)
Admission: AD | Disposition: A | Discharge status: Home / Self Care | Payer: Self-pay | Source: Ambulatory Visit | Attending: Obstetrics & Gynecology

## 2015-06-10 LAB — COMPREHENSIVE METABOLIC PANEL
ALBUMIN: 2.8 g/dL — AB (ref 3.5–5.0)
ALBUMIN: 2.3 g/dL — AB (ref 3.5–5.0)
ALK PHOS: 131 U/L — AB (ref 38–126)
ALK PHOS: 113 U/L (ref 38–126)
ALT: 248 U/L — AB (ref 14–54)
ALT: 184 U/L — AB (ref 14–54)
ALT: 82 U/L — AB (ref 14–54)
ANION GAP: 11 (ref 5–15)
AST: 261 U/L — ABNORMAL HIGH (ref 15–41)
AST: 389 U/L — AB (ref 15–41)
AST: 98 U/L — ABNORMAL HIGH (ref 15–41)
Albumin: 2.8 g/dL — ABNORMAL LOW (ref 3.5–5.0)
Alkaline Phosphatase: 133 U/L — ABNORMAL HIGH (ref 38–126)
Anion gap: 12 (ref 5–15)
Anion gap: 11 (ref 5–15)
BILIRUBIN TOTAL: 0.7 mg/dL (ref 0.3–1.2)
BILIRUBIN TOTAL: 0.7 mg/dL (ref 0.3–1.2)
BUN: 11 mg/dL (ref 6–20)
BUN: 12 mg/dL (ref 6–20)
BUN: 12 mg/dL (ref 6–20)
CALCIUM: 7.8 mg/dL — AB (ref 8.9–10.3)
CALCIUM: 7 mg/dL — AB (ref 8.9–10.3)
CHLORIDE: 103 mmol/L (ref 101–111)
CO2: 20 mmol/L — ABNORMAL LOW (ref 22–32)
CO2: 22 mmol/L (ref 22–32)
CO2: 22 mmol/L (ref 22–32)
CREATININE: 0.7 mg/dL (ref 0.44–1.00)
CREATININE: 0.75 mg/dL (ref 0.44–1.00)
CREATININE: 0.82 mg/dL (ref 0.44–1.00)
Calcium: 9.1 mg/dL (ref 8.9–10.3)
Chloride: 100 mmol/L — ABNORMAL LOW (ref 101–111)
Chloride: 99 mmol/L — ABNORMAL LOW (ref 101–111)
GFR calc Af Amer: >60 mL/min (ref >60)
GFR calc Af Amer: >60 mL/min (ref >60)
GFR calc non Af Amer: >60 mL/min (ref >60)
GLUCOSE: 106 mg/dL — AB (ref 65–99)
GLUCOSE: 130 mg/dL — AB (ref 65–99)
Glucose, Bld: 138 mg/dL — ABNORMAL HIGH (ref 65–99)
Potassium: 4 mmol/L (ref 3.5–5.1)
Potassium: 4.1 mmol/L (ref 3.5–5.1)
Potassium: 4.3 mmol/L (ref 3.5–5.1)
SODIUM: 132 mmol/L — AB (ref 135–145)
SODIUM: 136 mmol/L (ref 135–145)
Sodium: 132 mmol/L — ABNORMAL LOW (ref 135–145)
TOTAL PROTEIN: 6.1 g/dL — AB (ref 6.5–8.1)
TOTAL PROTEIN: 5.5 g/dL — AB (ref 6.5–8.1)
Total Bilirubin: 0.5 mg/dL (ref 0.3–1.2)
Total Protein: 6.4 g/dL — ABNORMAL LOW (ref 6.5–8.1)

## 2015-06-10 LAB — CBC
HCT: 30.9 % — ABNORMAL LOW (ref 36.0–46.0)
HEMATOCRIT: 38.9 % (ref 36.0–46.0)
HEMOGLOBIN: 11 g/dL — AB (ref 12.0–15.0)
Hemoglobin: 13.4 g/dL (ref 12.0–15.0)
MCH: 31.5 pg (ref 26.0–34.0)
MCH: 32.7 pg (ref 26.0–34.0)
MCHC: 34.4 g/dL (ref 30.0–36.0)
MCHC: 35.6 g/dL (ref 30.0–36.0)
MCV: 91.5 fL (ref 78.0–100.0)
MCV: 92 fL (ref 78.0–100.0)
PLATELETS: 93 10*3/uL — AB (ref 150–400)
PLATELETS: 82 10*3/uL — AB (ref 150–400)
RBC: 4.25 MIL/uL (ref 3.87–5.11)
RBC: 3.36 MIL/uL — ABNORMAL LOW (ref 3.87–5.11)
RDW: 14.1 % (ref 11.5–15.5)
RDW: 14.2 % (ref 11.5–15.5)
WBC: 10.2 10*3/uL (ref 4.0–10.5)
WBC: 12.3 10*3/uL — ABNORMAL HIGH (ref 4.0–10.5)

## 2015-06-10 LAB — LACTATE DEHYDROGENASE
LDH: 572 U/L — AB (ref 98–192)
LDH: 538 U/L — ABNORMAL HIGH (ref 98–192)
LDH: 252 U/L — AB (ref 98–192)

## 2015-06-10 LAB — PROTEIN / CREATININE RATIO, URINE
CREATININE, URINE: 101 mg/dL
Protein Creatinine Ratio: 4.28 mg/mg{Cre} — ABNORMAL HIGH (ref 0.00–0.15)
TOTAL PROTEIN, URINE: 432 mg/dL

## 2015-06-10 LAB — MAGNESIUM
Magnesium: 6.9 mg/dL (ref 1.7–2.4)
Magnesium: 7.9 mg/dL (ref 1.7–2.4)
Magnesium: 6.5 mg/dL (ref 1.7–2.4)

## 2015-06-10 LAB — URIC ACID
URIC ACID, SERUM: 6.6 mg/dL (ref 2.3–6.6)
URIC ACID, SERUM: 6.6 mg/dL (ref 2.3–6.6)
Uric Acid, Serum: 6.3 mg/dL (ref 2.3–6.6)

## 2015-06-10 LAB — RPR: RPR: NONREACTIVE

## 2015-06-10 SURGERY — Surgical Case
Anesthesia: Epidural | Site: Abdomen

## 2015-06-10 MED ORDER — FENTANYL CITRATE (PF) 100 MCG/2ML IJ SOLN
100.0000 ug | INTRAMUSCULAR | Status: DC | PRN
  Administered 2015-06-10: 100 ug via INTRAVENOUS
  Filled 2015-06-10: qty 2

## 2015-06-10 MED ORDER — WITCH HAZEL-GLYCERIN EX PADS: 1.0000 "application " | MEDICATED_PAD | CUTANEOUS | Status: DC | PRN

## 2015-06-10 MED ORDER — HYDRALAZINE HCL 20 MG/ML IJ SOLN: 10.0000 mg | Freq: Once | INTRAMUSCULAR | Status: DC | PRN

## 2015-06-10 MED ORDER — FENTANYL CITRATE (PF) 100 MCG/2ML IJ SOLN
INTRAMUSCULAR | Status: AC
  Filled 2015-06-10: qty 2

## 2015-06-10 MED ORDER — SODIUM BICARBONATE 8.4 % IV SOLN
INTRAVENOUS | Status: DC | PRN
  Administered 2015-06-10: 1 mL via EPIDURAL
  Administered 2015-06-10: 2 mL via EPIDURAL

## 2015-06-10 MED ORDER — DIBUCAINE 1 % RE OINT: 1.0000 "application " | TOPICAL_OINTMENT | RECTAL | Status: DC | PRN

## 2015-06-10 MED ORDER — MEPERIDINE HCL 25 MG/ML IJ SOLN: 6.2500 mg | INTRAMUSCULAR | Status: DC | PRN

## 2015-06-10 MED ORDER — SODIUM CHLORIDE 0.9 % IJ SOLN: 3.0000 mL | INTRAMUSCULAR | Status: DC | PRN

## 2015-06-10 MED ORDER — ONDANSETRON HCL 4 MG/2ML IJ SOLN
INTRAMUSCULAR | Status: DC | PRN
  Administered 2015-06-10: 4 mg via INTRAVENOUS

## 2015-06-10 MED ORDER — DIPHENHYDRAMINE HCL 50 MG/ML IJ SOLN: 12.5000 mg | INTRAMUSCULAR | Status: DC | PRN

## 2015-06-10 MED ORDER — LACTATED RINGERS IV SOLN
INTRAVENOUS | Status: DC
  Administered 2015-06-11 (×2): via INTRAVENOUS

## 2015-06-10 MED ORDER — EPHEDRINE SULFATE 50 MG/ML IJ SOLN
INTRAMUSCULAR | Status: DC | PRN
  Administered 2015-06-10 (×5): 5 mg via INTRAVENOUS

## 2015-06-10 MED ORDER — NALBUPHINE HCL 10 MG/ML IJ SOLN: 5.0000 mg | Freq: Once | INTRAMUSCULAR | Status: DC | PRN

## 2015-06-10 MED ORDER — PHENYLEPHRINE 40 MCG/ML (10ML) SYRINGE FOR IV PUSH (FOR BLOOD PRESSURE SUPPORT)
80.0000 ug | PREFILLED_SYRINGE | INTRAVENOUS | Status: DC | PRN
  Filled 2015-06-10: qty 20

## 2015-06-10 MED ORDER — LACTATED RINGERS IV SOLN
INTRAVENOUS | Status: DC | PRN
  Administered 2015-06-10 (×2): via INTRAVENOUS

## 2015-06-10 MED ORDER — SCOPOLAMINE 1 MG/3DAYS TD PT72: 1.0000 | MEDICATED_PATCH | Freq: Once | TRANSDERMAL | Status: DC

## 2015-06-10 MED ORDER — MAGNESIUM SULFATE 50 % IJ SOLN
2.0000 g/h | INTRAVENOUS | Status: AC
  Administered 2015-06-10: 2 g/h via INTRAVENOUS
  Filled 2015-06-10 (×2): qty 80

## 2015-06-10 MED ORDER — ONDANSETRON HCL 4 MG/2ML IJ SOLN
4.0000 mg | Freq: Three times a day (TID) | INTRAMUSCULAR | Status: DC | PRN
Start: 2015-06-10 — End: 2015-06-14
  Administered 2015-06-10: 4 mg via INTRAVENOUS
  Filled 2015-06-10: qty 2

## 2015-06-10 MED ORDER — OXYCODONE-ACETAMINOPHEN 5-325 MG PO TABS: 1.0000 | ORAL_TABLET | ORAL | Status: DC | PRN

## 2015-06-10 MED ORDER — MENTHOL 3 MG MT LOZG
1.0000 | LOZENGE | OROMUCOSAL | Status: DC | PRN
Start: 2015-06-10 — End: 2015-06-14
  Filled 2015-06-10: qty 9

## 2015-06-10 MED ORDER — MORPHINE SULFATE (PF) 0.5 MG/ML IJ SOLN
INTRAMUSCULAR | Status: DC | PRN
  Administered 2015-06-10: .1 mg via INTRATHECAL

## 2015-06-10 MED ORDER — IBUPROFEN 600 MG PO TABS
600.0000 mg | ORAL_TABLET | Freq: Four times a day (QID) | ORAL | Status: DC
  Administered 2015-06-11 – 2015-06-14 (×12): 600 mg via ORAL
  Filled 2015-06-10 (×12): qty 1

## 2015-06-10 MED ORDER — SIMETHICONE 80 MG PO CHEW
80.0000 mg | CHEWABLE_TABLET | ORAL | Status: DC | PRN
  Administered 2015-06-12: 80 mg via ORAL

## 2015-06-10 MED ORDER — BISACODYL 10 MG RE SUPP
10.0000 mg | Freq: Every day | RECTAL | Status: DC | PRN
  Filled 2015-06-10: qty 1

## 2015-06-10 MED ORDER — TETANUS-DIPHTH-ACELL PERTUSSIS 5-2.5-18.5 LF-MCG/0.5 IM SUSP: 0.5000 mL | Freq: Once | INTRAMUSCULAR | Status: DC

## 2015-06-10 MED ORDER — ACETAMINOPHEN 325 MG PO TABS
650.0000 mg | ORAL_TABLET | ORAL | Status: DC | PRN
  Administered 2015-06-12 (×2): 650 mg via ORAL
  Filled 2015-06-10 (×2): qty 2

## 2015-06-10 MED ORDER — OXYTOCIN 10 UNIT/ML IJ SOLN
40.0000 [IU] | INTRAMUSCULAR | Status: DC | PRN
Start: 2015-06-10 — End: 2015-06-10
  Administered 2015-06-10: 40 [IU] via INTRAVENOUS

## 2015-06-10 MED ORDER — HYDRALAZINE HCL 20 MG/ML IJ SOLN: 10.0000 mg | Freq: Once | INTRAMUSCULAR | Status: DC

## 2015-06-10 MED ORDER — OXYCODONE-ACETAMINOPHEN 5-325 MG PO TABS: 2.0000 | ORAL_TABLET | ORAL | Status: DC | PRN

## 2015-06-10 MED ORDER — OXYTOCIN 10 UNIT/ML IJ SOLN: 2.5000 [IU]/h | INTRAVENOUS | Status: AC

## 2015-06-10 MED ORDER — SIMETHICONE 80 MG PO CHEW
80.0000 mg | CHEWABLE_TABLET | Freq: Three times a day (TID) | ORAL | Status: DC
  Administered 2015-06-11 – 2015-06-13 (×8): 80 mg via ORAL
  Filled 2015-06-10 (×15): qty 1

## 2015-06-10 MED ORDER — FENTANYL 2.5 MCG/ML BUPIVACAINE 1/10 % EPIDURAL INFUSION (WH - ANES)
14.0000 mL/h | INTRAMUSCULAR | Status: DC | PRN
  Filled 2015-06-10: qty 125

## 2015-06-10 MED ORDER — MORPHINE SULFATE (PF) 0.5 MG/ML IJ SOLN
INTRAMUSCULAR | Status: AC
  Filled 2015-06-10: qty 10

## 2015-06-10 MED ORDER — DIPHENHYDRAMINE HCL 25 MG PO CAPS
25.0000 mg | ORAL_CAPSULE | ORAL | Status: DC | PRN
  Filled 2015-06-10: qty 1

## 2015-06-10 MED ORDER — CEFAZOLIN SODIUM-DEXTROSE 2-3 GM-% IV SOLR
2.0000 g | Freq: Once | INTRAVENOUS | Status: AC
Start: 2015-06-10 — End: 2015-06-10
  Administered 2015-06-10: 2 g via INTRAVENOUS
  Filled 2015-06-10: qty 50

## 2015-06-10 MED ORDER — CEFAZOLIN SODIUM-DEXTROSE 1-4 GM-% IV SOLR: 2.0000 g | Freq: Once | INTRAVENOUS | Status: DC

## 2015-06-10 MED ORDER — NALBUPHINE HCL 10 MG/ML IJ SOLN: 5.0000 mg | INTRAMUSCULAR | Status: DC | PRN

## 2015-06-10 MED ORDER — CEFAZOLIN SODIUM-DEXTROSE 2-3 GM-% IV SOLR: 2.0000 g | INTRAVENOUS | Status: DC

## 2015-06-10 MED ORDER — SODIUM CHLORIDE 0.9 % IR SOLN
Status: DC | PRN
  Administered 2015-06-10: 1000 mL

## 2015-06-10 MED ORDER — LABETALOL HCL 5 MG/ML IV SOLN
10.0000 mg | INTRAVENOUS | Status: DC | PRN
  Administered 2015-06-10: 10 mg via INTRAVENOUS
  Filled 2015-06-10: qty 4

## 2015-06-10 MED ORDER — MAGNESIUM SULFATE BOLUS VIA INFUSION
6.0000 g | Freq: Once | INTRAVENOUS | Status: AC
  Administered 2015-06-10: 6 g via INTRAVENOUS
  Filled 2015-06-10: qty 500

## 2015-06-10 MED ORDER — NALOXONE HCL 2 MG/2ML IJ SOSY: 1.0000 ug/kg/h | PREFILLED_SYRINGE | INTRAVENOUS | Status: DC | PRN

## 2015-06-10 MED ORDER — SENNOSIDES-DOCUSATE SODIUM 8.6-50 MG PO TABS
2.0000 | ORAL_TABLET | ORAL | Status: DC
  Administered 2015-06-11 – 2015-06-13 (×3): 2 via ORAL
  Filled 2015-06-10 (×5): qty 2

## 2015-06-10 MED ORDER — PHENYLEPHRINE 8 MG IN D5W 100 ML (0.08MG/ML) PREMIX OPTIME
INJECTION | INTRAVENOUS | Status: DC | PRN
  Administered 2015-06-10: 30 ug/min via INTRAVENOUS

## 2015-06-10 MED ORDER — LANOLIN HYDROUS EX OINT: 1.0000 "application " | TOPICAL_OINTMENT | CUTANEOUS | Status: DC | PRN

## 2015-06-10 MED ORDER — ZOLPIDEM TARTRATE 5 MG PO TABS: 5.0000 mg | ORAL_TABLET | Freq: Every evening | ORAL | Status: DC | PRN

## 2015-06-10 MED ORDER — EPHEDRINE 5 MG/ML INJ: 10.0000 mg | INTRAVENOUS | Status: DC | PRN

## 2015-06-10 MED ORDER — MEASLES, MUMPS & RUBELLA VAC ~~LOC~~ INJ: 0.5000 mL | INJECTION | Freq: Once | SUBCUTANEOUS | Status: DC

## 2015-06-10 MED ORDER — LACTATED RINGERS IV SOLN
INTRAVENOUS | Status: DC | PRN
  Administered 2015-06-10: 12:00:00 via INTRAVENOUS

## 2015-06-10 MED ORDER — SIMETHICONE 80 MG PO CHEW
80.0000 mg | CHEWABLE_TABLET | ORAL | Status: DC
Start: 2015-06-11 — End: 2015-06-14
  Administered 2015-06-11 – 2015-06-13 (×4): 80 mg via ORAL
  Filled 2015-06-10 (×6): qty 1

## 2015-06-10 MED ORDER — NALOXONE HCL 0.4 MG/ML IJ SOLN: 0.4000 mg | INTRAMUSCULAR | Status: DC | PRN

## 2015-06-10 MED ORDER — FLEET ENEMA 7-19 GM/118ML RE ENEM: 1.0000 | ENEMA | Freq: Every day | RECTAL | Status: DC | PRN

## 2015-06-10 MED ORDER — FERROUS SULFATE 325 (65 FE) MG PO TABS
325.0000 mg | ORAL_TABLET | Freq: Two times a day (BID) | ORAL | Status: DC
  Administered 2015-06-11 – 2015-06-13 (×6): 325 mg via ORAL
  Filled 2015-06-10 (×7): qty 1

## 2015-06-10 MED ORDER — PRENATAL MULTIVITAMIN CH
1.0000 | ORAL_TABLET | Freq: Every day | ORAL | Status: DC
  Administered 2015-06-11 – 2015-06-13 (×3): 1 via ORAL
  Filled 2015-06-10 (×3): qty 1

## 2015-06-10 MED ORDER — DIPHENHYDRAMINE HCL 25 MG PO CAPS
25.0000 mg | ORAL_CAPSULE | Freq: Four times a day (QID) | ORAL | Status: DC | PRN
  Filled 2015-06-10: qty 1

## 2015-06-10 MED ORDER — FENTANYL CITRATE (PF) 100 MCG/2ML IJ SOLN: 25.0000 ug | INTRAMUSCULAR | Status: DC | PRN

## 2015-06-10 MED ORDER — ACETAMINOPHEN 500 MG PO TABS
1000.0000 mg | ORAL_TABLET | Freq: Four times a day (QID) | ORAL | Status: AC
  Administered 2015-06-11 (×2): 1000 mg via ORAL
  Filled 2015-06-10 (×3): qty 2

## 2015-06-10 MED ORDER — MAGNESIUM SULFATE 40 G IN LACTATED RINGERS - SIMPLE
INTRAVENOUS | Status: DC | PRN
  Administered 2015-06-10: 2 g/h via INTRAVENOUS

## 2015-06-10 MED ORDER — LABETALOL HCL 5 MG/ML IV SOLN
20.0000 mg | INTRAVENOUS | Status: DC | PRN
  Administered 2015-06-10: 20 mg via INTRAVENOUS

## 2015-06-10 SURGICAL SUPPLY — 40 items
BENZOIN TINCTURE PRP APPL 2/3 (GAUZE/BANDAGES/DRESSINGS) ×3 IMPLANT
CLAMP CORD UMBIL (MISCELLANEOUS) IMPLANT
CLOSURE STERI STRIP 1/2 X4 (GAUZE/BANDAGES/DRESSINGS) ×2 IMPLANT
CLOSURE WOUND 1/2 X4 (GAUZE/BANDAGES/DRESSINGS) ×1
CLOTH BEACON ORANGE TIMEOUT ST (SAFETY) ×3 IMPLANT
CONTAINER PREFILL 10% NBF 15ML (MISCELLANEOUS) IMPLANT
DRAIN JACKSON PRT FLT 10 (DRAIN) IMPLANT
DRAPE SHEET LG 3/4 BI-LAMINATE (DRAPES) IMPLANT
DRSG OPSITE POSTOP 4X10 (GAUZE/BANDAGES/DRESSINGS) ×3 IMPLANT
DURAPREP 26ML APPLICATOR (WOUND CARE) ×3 IMPLANT
ELECT REM PT RETURN 9FT ADLT (ELECTROSURGICAL) ×3
ELECTRODE REM PT RTRN 9FT ADLT (ELECTROSURGICAL) ×1 IMPLANT
EVACUATOR SILICONE 100CC (DRAIN) IMPLANT
EXTRACTOR VACUUM M CUP 4 TUBE (SUCTIONS) IMPLANT
EXTRACTOR VACUUM M CUP 4' TUBE (SUCTIONS)
GLOVE BIO SURGEON STRL SZ 6.5 (GLOVE) ×2 IMPLANT
GLOVE BIO SURGEONS STRL SZ 6.5 (GLOVE) ×1
GLOVE BIOGEL PI IND STRL 7.0 (GLOVE) ×2 IMPLANT
GLOVE BIOGEL PI INDICATOR 7.0 (GLOVE) ×4
GOWN STRL REUS W/TWL LRG LVL3 (GOWN DISPOSABLE) ×6 IMPLANT
HEMOSTAT SURGICEL 2X14 (HEMOSTASIS) ×3 IMPLANT
KIT ABG SYR 3ML LUER SLIP (SYRINGE) IMPLANT
NEEDLE HYPO 25X5/8 SAFETYGLIDE (NEEDLE) IMPLANT
NS IRRIG 1000ML POUR BTL (IV SOLUTION) ×3 IMPLANT
PACK C SECTION WH (CUSTOM PROCEDURE TRAY) ×3 IMPLANT
PAD OB MATERNITY 4.3X12.25 (PERSONAL CARE ITEMS) ×3 IMPLANT
PENCIL SMOKE EVAC W/HOLSTER (ELECTROSURGICAL) ×3 IMPLANT
RTRCTR C-SECT PINK 25CM LRG (MISCELLANEOUS) IMPLANT
SPONGE LAP 18X18 X RAY DECT (DISPOSABLE) ×6 IMPLANT
STRIP CLOSURE SKIN 1/2X4 (GAUZE/BANDAGES/DRESSINGS) ×2 IMPLANT
SUT CHROMIC 0 CT 1 (SUTURE) ×3 IMPLANT
SUT MNCRL AB 3-0 PS2 27 (SUTURE) ×3 IMPLANT
SUT PLAIN 2 0 (SUTURE) ×4
SUT PLAIN 2 0 XLH (SUTURE) ×3 IMPLANT
SUT PLAIN ABS 2-0 CT1 27XMFL (SUTURE) ×2 IMPLANT
SUT SILK 2 0 SH (SUTURE) IMPLANT
SUT VIC AB 0 CTX 36 (SUTURE) ×8
SUT VIC AB 0 CTX36XBRD ANBCTRL (SUTURE) ×4 IMPLANT
TOWEL OR 17X24 6PK STRL BLUE (TOWEL DISPOSABLE) ×3 IMPLANT
TRAY FOLEY CATH SILVER 14FR (SET/KITS/TRAYS/PACK) ×3 IMPLANT

## 2015-06-10 NOTE — Progress Notes (Signed)
Patient ID: Kristina Gordon, female   DOB: August 02, 1980, 35 y.o.   MRN: [id number] Pt c/o headache, RUQ pain and blurred vision BP 167/95 mmHg  Pulse 80  Temp(Src) 98.1 F (36.7 C) (Oral)  Resp 16  Ht 4' 5"  (1.346 m)  Wt 135 lb (61.236 kg)  BMI 33.80 kg/m2  LMP 09/09/2014 Recent Results (from the past 2160 hour(s))  OB RESULTS CONSOLE RPR     Status: None   Collection Time: 03/18/15 12:00 AM  Result Value Ref Range   RPR Nonreactive   OB RESULT CONSOLE Group B Strep     Status: None   Collection Time: 05/20/15 12:00 AM  Result Value Ref Range   GBS Negative   Fern Test     Status: None   Collection Time: 06/09/15  7:37 PM  Result Value Ref Range   POCT Fern Test Positive = ruptured amniotic membanes   CBC     Status: None   Collection Time: 06/09/15  8:28 PM  Result Value Ref Range   WBC 8.0 4.0 - 10.5 K/uL   RBC 4.04 3.87 - 5.11 MIL/uL   Hemoglobin 13.1 12.0 - 15.0 g/dL   HCT 37.4 36.0 - 46.0 %   MCV 92.6 78.0 - 100.0 fL   MCH 32.4 26.0 - 34.0 pg   MCHC 35.0 30.0 - 36.0 g/dL   RDW 13.7 11.5 - 15.5 %   Platelets 152 150 - 400 K/uL  Type and screen [facility]     Status: None   Collection Time: 06/09/15  8:28 PM  Result Value Ref Range   ABO/RH(D) A POS    Antibody Screen NEG    Sample Expiration 06/12/2015   RPR     Status: None   Collection Time: 06/09/15  8:28 PM  Result Value Ref Range   RPR Ser Ql Non Reactive Non Reactive    Comment: (NOTE) Performed At: [facility] [place], [place] [id number] Lindon Romp MD [id number]   Comprehensive metabolic panel     Status: Abnormal   Collection Time: 06/09/15 11:30 PM  Result Value Ref Range   Sodium 136 135 - 145 mmol/L   Potassium 4.3 3.5 - 5.1 mmol/L   Chloride 103 101 - 111 mmol/L   CO2 22 22 - 32 mmol/L   Glucose, Bld 138 (H) 65 - 99 mg/dL   BUN 12 6 - 20 mg/dL   Creatinine, Ser 0.82 0.44 - 1.00 mg/dL   Calcium 9.1 8.9 - 10.3 mg/dL   Total Protein 6.4 (L)  6.5 - 8.1 g/dL   Albumin 2.8 (L) 3.5 - 5.0 g/dL   AST 98 (H) 15 - 41 U/L   ALT 82 (H) 14 - 54 U/L   Alkaline Phosphatase 133 (H) 38 - 126 U/L   Total Bilirubin 0.5 0.3 - 1.2 mg/dL   GFR calc non Af Amer >60 >60 mL/min   GFR calc Af Amer >60 >60 mL/min    Comment: (NOTE) The eGFR has been calculated using the CKD EPI equation. This calculation has not been validated in all clinical situations. eGFR's persistently <60 mL/min signify possible Chronic Kidney Disease.    Anion gap 11 5 - 15  Lactate dehydrogenase     Status: Abnormal   Collection Time: 06/09/15 11:30 PM  Result Value Ref Range   LDH 252 (H) 98 - 192 U/L  Uric acid     Status: None   Collection Time: 06/09/15 11:30 PM  Result Value Ref Range   Uric Acid, Serum 6.6 2.3 - 6.6 mg/dL  Protein / creatinine ratio, urine     Status: Abnormal   Collection Time: 06/10/15  1:00 AM  Result Value Ref Range   Creatinine, Urine 101.00 mg/dL   Total Protein, Urine 432 mg/dL    Comment: NO NORMAL RANGE ESTABLISHED FOR THIS TEST RESULTS CONFIRMED BY MANUAL DILUTION    Protein Creatinine Ratio 4.28 (H) 0.00 - 0.15 mg/mg[Cre]  Comprehensive metabolic panel     Status: Abnormal   Collection Time: 06/10/15  9:45 AM  Result Value Ref Range   Sodium 132 (L) 135 - 145 mmol/L   Potassium 4.0 3.5 - 5.1 mmol/L   Chloride 100 (L) 101 - 111 mmol/L   CO2 20 (L) 22 - 32 mmol/L   Glucose, Bld 106 (H) 65 - 99 mg/dL   BUN 11 6 - 20 mg/dL   Creatinine, Ser 0.70 0.44 - 1.00 mg/dL   Calcium 7.8 (L) 8.9 - 10.3 mg/dL   Total Protein 6.1 (L) 6.5 - 8.1 g/dL   Albumin 2.8 (L) 3.5 - 5.0 g/dL   AST 389 (H) 15 - 41 U/L   ALT 248 (H) 14 - 54 U/L   Alkaline Phosphatase 131 (H) 38 - 126 U/L   Total Bilirubin 0.7 0.3 - 1.2 mg/dL   GFR calc non Af Amer >60 >60 mL/min   GFR calc Af Amer >60 >60 mL/min    Comment: (NOTE) The eGFR has been calculated using the CKD EPI equation. This calculation has not been validated in all clinical situations. eGFR's  persistently <60 mL/min signify possible Chronic Kidney Disease.    Anion gap 12 5 - 15  CBC     Status: Abnormal   Collection Time: 06/10/15  9:45 AM  Result Value Ref Range   WBC 10.2 4.0 - 10.5 K/uL   RBC 4.25 3.87 - 5.11 MIL/uL   Hemoglobin 13.4 12.0 - 15.0 g/dL   HCT 38.9 36.0 - 46.0 %   MCV 91.5 78.0 - 100.0 fL   MCH 31.5 26.0 - 34.0 pg   MCHC 34.4 30.0 - 36.0 g/dL   RDW 14.1 11.5 - 15.5 %   Platelets 93 (L) 150 - 400 K/uL    Comment: REPEATED TO VERIFY SPECIMEN CHECKED FOR CLOTS PLATELET COUNT CONFIRMED BY SMEAR   Lactate dehydrogenase     Status: Abnormal   Collection Time: 06/10/15  9:45 AM  Result Value Ref Range   LDH 572 (H) 98 - 192 U/L  Uric acid     Status: None   Collection Time: 06/10/15  9:45 AM  Result Value Ref Range   Uric Acid, Serum 6.6 2.3 - 6.6 mg/dL  Magnesium     Status: Abnormal   Collection Time: 06/10/15  9:45 AM  Result Value Ref Range   Magnesium 6.9 (HH) 1.7 - 2.4 mg/dL    Comment: CRITICAL RESULT CALLED TO, READ BACK BY AND VERIFIED WITH: J.MIDDLETON RN.@1028  ON 1.10.17 BY TCALDWELL MT    UO 200 cc for last 6 hours Preeclampsia with HEELP and worsening Recommend CS R&B reviewed with the pt.  Interpreter used.   Will proceed with CS

## 2015-06-10 NOTE — Anesthesia Postprocedure Evaluation (Signed)
Anesthesia Post Note  Patient: Kristina Gordon  Procedure(s) Performed: Procedure(s) (LRB): CESAREAN SECTION (N/A)  Patient location during evaluation: Antenatal Anesthesia Type: Epidural Level of consciousness: oriented and awake and alert Pain management: pain level controlled Vital Signs Assessment: post-procedure vital signs reviewed and stable Respiratory status: spontaneous breathing and respiratory function stable Cardiovascular status: blood pressure returned to baseline Postop Assessment: no headache, no backache, no signs of nausea or vomiting, adequate PO intake and patient able to bend at knees Anesthetic complications: no    Last Vitals:  Filed Vitals:   06/10/15 2000 06/10/15 2018  BP: 167/77   Pulse: 75 75  Temp:  36.4 C  Resp:      Last Pain:  Filed Vitals:   06/10/15 2026  PainSc: 0-No pain                 Enyla Lisbon

## 2015-06-10 NOTE — Anesthesia Postprocedure Evaluation (Signed)
Anesthesia Post Note  Patient: Kristina Gordon  Procedure(s) Performed: Procedure(s) (LRB): CESAREAN SECTION (N/A)  Patient location during evaluation: PACU Anesthesia Type: Spinal Level of consciousness: awake Pain management: satisfactory to patient Vital Signs Assessment: post-procedure vital signs reviewed and stable Respiratory status: spontaneous breathing Cardiovascular status: blood pressure returned to baseline Postop Assessment: no headache and spinal receding Anesthetic complications: no Comments: Platelet count dropped post-surg Will leave epidural catheter in place    Last Vitals:  Filed Vitals:   06/10/15 1454 06/10/15 1457  BP:    Pulse: 69   Temp:  36.3 C  Resp: 13     Last Pain:  Filed Vitals:   06/10/15 1457  PainSc: 0-No pain                 Tarissa Kerin EDWARD

## 2015-06-10 NOTE — Anesthesia Procedure Notes (Signed)
Spinal Patient location during procedure: OR Preanesthetic Checklist Completed: patient identified, site marked, surgical consent, pre-op evaluation, timeout performed, IV checked, risks and benefits discussed and monitors and equipment checked Spinal Block Patient position: sitting Prep: DuraPrep Patient monitoring: cardiac monitor, continuous pulse ox, blood pressure and heart rate Approach: midline Location: L3-4 Injection technique: catheter Needle Needle type: Tuohy and Sprotte  Needle gauge: 24 G Needle length: 12.7 cm Needle insertion depth: 6 cm Catheter type: closed end flexible Catheter size: 19 g Catheter at skin depth: 11 cm Additional Notes Spinal Dosage in OR  Bupivicaine ml       .8 PFMS04   mcg        100

## 2015-06-10 NOTE — Consult Note (Signed)
Neonatology Note:   Attendance at C-section:   I was asked by Dr. Dillard to attend this urgent C/S at term due to worsening pre-eclampsia and concerns for HELLP syndrome. Mother is a 34 y.o. female, G2P0010 at 39.1 weeks, presenting for spontaneous rupture of membranes at 1800 on 1/9. GBS neg with otherwise reassuring labs. Fluid clear. Infant vigorous with good spontaneous cry and tone. Needed only minimal bulb suctioning. Ap 8 adn 9. Lungs clear to ausc in DR. To CN to care of Pediatrician.  Kristina Buckholtz, MD 

## 2015-06-10 NOTE — Progress Notes (Addendum)
Subjective: Pt nauseuos and hot from magnesium sulfate but overall coping well.  Currently complaining of right epigastric pain.  Husband at bedside providing support. Denies HA or blurred vision.   Objective: BP 159/94 mmHg  Pulse 88  Temp(Src) 98.1 F (36.7 C) (Oral)  Resp 18  Ht 4\' 5"  (1.346 m)  Wt 61.236 kg (135 lb)  BMI 33.80 kg/m2  LMP 09/09/2014   Total I/O In: 699.9 [I.V.:699.9] Out: 175 [Urine:100; Emesis/NG output:75]  FHT: Category 1, 140 bpm, moderate variability, +accels, no decels UC:   regular, every 3 minutes SVE:   Dilation: 2 Effacement (%): 80 Station: -2, -3 Exam by:: Alphonzo Severanceachel Diogenes Whirley CNM SROM at 1800 Pitocin at 11 milli-units/minute  Assessment:  IUP 39.1 wks Cat 1 FT Pre-Eclampsia with Severe features SROM x hrs Elevated BP  S/p Labetalol x3 S/p hydralazine x1 GBS negative  Plan: Continue pitocin augmentation Continue to watch BP May have Iv pain med/epidural  Report to oncoming CNM, Venus Standard  [employer]ys[employer] Hervey Wedig CNM, [place] 06/10/2015, 6:16 AM

## 2015-06-10 NOTE — Progress Notes (Addendum)
Subjective: Coping with contractions.  Husband at bedside.    Objective: BP 154/86 mmHg  Pulse 97  Temp(Src) 97.8 F (36.6 C) (Oral)  Resp 18  Ht 4\' 5"  (1.346 m)  Wt 61.236 kg (135 lb)  BMI 33.80 kg/m2  LMP 09/09/2014     Filed Vitals:   06/10/15 0221 06/10/15 0231 06/10/15 0251 06/10/15 0301  BP: 153/101 161/101 152/88 154/86  Pulse: 78 76 96 97  Temp:      TempSrc:      Resp: 18 18 18 18   Height:      Weight:       . Results for orders placed or performed during the hospital encounter of 06/09/15 (from the past 24 hour(s))  Fern Test     Status: None   Collection Time: 06/09/15  7:37 PM  Result Value Ref Range   POCT Fern Test Positive = ruptured amniotic membanes   CBC     Status: None   Collection Time: 06/09/15  8:28 PM  Result Value Ref Range   WBC 8.0 4.0 - 10.5 K/uL   RBC 4.04 3.87 - 5.11 MIL/uL   Hemoglobin 13.1 12.0 - 15.0 g/dL   HCT 16.137.4 09.636.0 - 04.546.0 %   MCV 92.6 78.0 - 100.0 fL   MCH 32.4 26.0 - 34.0 pg   MCHC 35.0 30.0 - 36.0 g/dL   RDW 40.913.7 81.111.5 - 91.415.5 %   Platelets 152 150 - 400 K/uL  Type and screen [facility]     Status: None   Collection Time: 06/09/15  8:28 PM  Result Value Ref Range   ABO/RH(D) A POS    Antibody Screen NEG    Sample Expiration 06/12/2015   Comprehensive metabolic panel     Status: Abnormal   Collection Time: 06/09/15 11:30 PM  Result Value Ref Range   Sodium 136 135 - 145 mmol/L   Potassium 4.3 3.5 - 5.1 mmol/L   Chloride 103 101 - 111 mmol/L   CO2 22 22 - 32 mmol/L   Glucose, Bld 138 (H) 65 - 99 mg/dL   BUN 12 6 - 20 mg/dL   Creatinine, Ser 7.820.82 0.44 - 1.00 mg/dL   Calcium 9.1 8.9 - 95.610.3 mg/dL   Total Protein 6.4 (L) 6.5 - 8.1 g/dL   Albumin 2.8 (L) 3.5 - 5.0 g/dL   AST 98 (H) 15 - 41 U/L   ALT 82 (H) 14 - 54 U/L   Alkaline Phosphatase 133 (H) 38 - 126 U/L   Total Bilirubin 0.5 0.3 - 1.2 mg/dL   GFR calc non Af Amer >60 >60 mL/min   GFR calc Af Amer >60 >60 mL/min   Anion gap 11 5 - 15   Lactate dehydrogenase     Status: Abnormal   Collection Time: 06/09/15 11:30 PM  Result Value Ref Range   LDH 252 (H) 98 - 192 U/L  Uric acid     Status: None   Collection Time: 06/09/15 11:30 PM  Result Value Ref Range   Uric Acid, Serum 6.6 2.3 - 6.6 mg/dL  Protein / creatinine ratio, urine     Status: Abnormal   Collection Time: 06/10/15  1:00 AM  Result Value Ref Range   Creatinine, Urine 101.00 mg/dL   Total Protein, Urine 432 mg/dL   Protein Creatinine Ratio 4.28 (H) 0.00 - 0.15 mg/mg[Cre]     FHT: Category  1, 140 bpm, moderate variability UC:   regular, every 4-5 minutes SVE:  Dilation: 2 Effacement (%): 80 Station: -3 Exam by:: racheal CNM  SROM at 1800 Pitocin at  3 milli-units/minute  Assessment: IUP 39.1 wks Cat 1 FT Pre-Eclampsia with Severe features  SROM x 9hrs Elevated BP  S/p Labetalol x2 S/p hydralazine x1 GBS negative   Plan: Continue pitocin augmentation Discussed elevated BP and diagnosis of preeclampsia.  Begin magnesium Sulfate infusion Continue to watch BP Dr. Haywood Lasso and aware of plan May have epidural    Kristina Gordon CNM, [place] 06/10/2015, 3:10 AM

## 2015-06-10 NOTE — Progress Notes (Signed)
Subjective: Uncomfortable. Reports pain at the top of her fundus every five minutes corresponding with her contractions.   States the pain is making her sick to her stomach. Desires pain medication. Ok with IV pain medicine, eventually plans an epidural . Denies HA, or blurry vision today but reports blurry vision and spots of light two days ago.    Objective: BP 163/90 mmHg  Pulse 77  Temp(Src) 97.9 F (36.6 C) (Oral)  Resp 18  Ht 4\' 5"  (1.346 m)  Wt 61.236 kg (135 lb)  BMI 33.80 kg/m2  LMP 09/09/2014   Filed Vitals:   06/10/15 0001 06/10/15 0031 06/10/15 0057 06/10/15 0112  BP: 156/93 175/96 185/93 163/90  Pulse: 68 79 73 77  Temp:      TempSrc:      Resp:      Height:      Weight:            Results for orders placed or performed during the hospital encounter of 06/09/15 (from the past 24 hour(s))  Fern Test     Status: None   Collection Time: 06/09/15  7:37 PM  Result Value Ref Range   POCT Fern Test Positive = ruptured amniotic membanes   CBC     Status: None   Collection Time: 06/09/15  8:28 PM  Result Value Ref Range   WBC 8.0 4.0 - 10.5 K/uL   RBC 4.04 3.87 - 5.11 MIL/uL   Hemoglobin 13.1 12.0 - 15.0 g/dL   HCT 78.437.4 69.636.0 - 29.546.0 %   MCV 92.6 78.0 - 100.0 fL   MCH 32.4 26.0 - 34.0 pg   MCHC 35.0 30.0 - 36.0 g/dL   RDW 28.413.7 13.211.5 - 44.015.5 %   Platelets 152 150 - 400 K/uL  Type and screen [facility]     Status: None   Collection Time: 06/09/15  8:28 PM  Result Value Ref Range   ABO/RH(D) A POS    Antibody Screen NEG    Sample Expiration 06/12/2015   Comprehensive metabolic panel     Status: Abnormal   Collection Time: 06/09/15 11:30 PM  Result Value Ref Range   Sodium 136 135 - 145 mmol/L   Potassium 4.3 3.5 - 5.1 mmol/L   Chloride 103 101 - 111 mmol/L   CO2 22 22 - 32 mmol/L   Glucose, Bld 138 (H) 65 - 99 mg/dL   BUN 12 6 - 20 mg/dL   Creatinine, Ser 1.020.82 0.44 - 1.00 mg/dL   Calcium 9.1 8.9 - 72.510.3 mg/dL   Total Protein 6.4 (L) 6.5 -  8.1 g/dL   Albumin 2.8 (L) 3.5 - 5.0 g/dL   AST 98 (H) 15 - 41 U/L   ALT 82 (H) 14 - 54 U/L   Alkaline Phosphatase 133 (H) 38 - 126 U/L   Total Bilirubin 0.5 0.3 - 1.2 mg/dL   GFR calc non Af Amer >60 >60 mL/min   GFR calc Af Amer >60 >60 mL/min   Anion gap 11 5 - 15  Lactate dehydrogenase     Status: Abnormal   Collection Time: 06/09/15 11:30 PM  Result Value Ref Range   LDH 252 (H) 98 - 192 U/L  Uric acid     Status: None   Collection Time: 06/09/15 11:30 PM  Result Value Ref Range   Uric Acid, Serum 6.6 2.3 - 6.6 mg/dL    FHT: Category 1, 366145 bpm, moderate variability, +accels, no decels UC:   Irregular  4-8 min SVE:   Dilation: 2 Effacement (%): 80 Station: -3 Exam by:: racheal CNM  SROM at 1800 (06/09/15)  Augmentation:  Pitocin: none Pain management: none currently  Assessment:  IUP at 39.0 SROM x 7hrs Elevated BP - 170-180/90's  PIH labs;    Uric 6.6, LDH 252, AST/ALT   98/82 AMA Language barrier- speaks spanish Abnormal genetic screen- increased risk for trisomy 21 (1:53)   Plan: May have IV pain medication, Fentanyl  Labetalol per protocol for severe range pressures Watch BP PCR pending Consult with Dr. Arnetha Massy Ellanora Rayborn CNM, [place] 06/10/2015, 1:20 AM

## 2015-06-10 NOTE — Progress Notes (Addendum)
Labor Progress  Subjective: Pt c/o mag like symptoms: dizziness, hot, weak.  She also is not c/o RUQ pain and vision changes.  spanish interpretor called to room to review POC and epidural option and benefits.  Pt elected to have an epidural.  Objective: BP 164/89 mmHg  Pulse 84  Temp(Src) 98.1 F (36.7 C) (Oral)  Resp 18  Ht 4\' 5"  (1.346 m)  Wt 135 lb (61.236 kg)  BMI 33.80 kg/m2  LMP 09/09/2014 I/O last 3 completed shifts: In: 874.1 [I.V.:874.1] Out: 175 [Urine:100; Emesis/NG output:75] Total I/O In: 488.8 [P.O.:60; I.V.:428.8] Out: 0  FHT: 130, moderate variability, + acel, no decel CTX:  regular, every 3 minutes Uterus gravid, soft non tender SVE:  Dilation: 2 Effacement (%): 80 Station: -3 Exam by:: CNM Betzaida Cremeens Pitocin at 17 mUn/min  Assessment:  IUP at 39.1 weeks NICHD: Category 1 Membranes:  SROM x 15hrs, no s/s of infection Labor progress: IOL Pitocin Augmentation GBS negative Magnesium 2gm maint.    Plan: Continue labor plan Continuous monitoring PIH and mag level labs Epidural IUPC placement after epidural Frequent position changes to facilitate fetal rotation and descent. Will reassess with cervical exam at 1100or earlier if necessary Continue pitocin per protocol      Kristina Gordon, CNM, MSN 06/10/2015. 10:02 AM

## 2015-06-10 NOTE — Anesthesia Preprocedure Evaluation (Signed)
Anesthesia Evaluation  Patient identified by MRN, date of birth, ID band Patient awake    Reviewed: Allergy & Precautions, H&P , Patient's Chart, lab work & pertinent test results  Airway Mallampati: II  TM Distance: >3 FB Neck ROM: full    Dental no notable dental hx.    Pulmonary    Pulmonary exam normal breath sounds clear to auscultation       Cardiovascular Exercise Tolerance: Good  Rhythm:regular Rate:Normal     Neuro/Psych    GI/Hepatic   Endo/Other    Renal/GU      Musculoskeletal   Abdominal   Peds  Hematology   Anesthesia Other Findings Severe PIH; BP, Liver enzymes, Oliguria Plts dropping to 93 Short stature; will do CSE  Reproductive/Obstetrics                             Anesthesia Physical Anesthesia Plan  ASA: III and emergent  Anesthesia Plan: Spinal, Epidural and Combined Spinal and Epidural   Post-op Pain Management:    Induction:   Airway Management Planned:   Additional Equipment:   Intra-op Plan:   Post-operative Plan:   Informed Consent: I have reviewed the patients History and Physical, chart, labs and discussed the procedure including the risks, benefits and alternatives for the proposed anesthesia with the patient or authorized representative who has indicated his/her understanding and acceptance.   Dental Advisory Given  Plan Discussed with: CRNA  Anesthesia Plan Comments: (Lab work confirmed with CRNA in room. Platelets okay. Discussed spinal anesthetic, and patient consents to the procedure:  included risk of possible headache,backache, failed block, allergic reaction, and nerve injury. This patient was asked if she had any questions or concerns before the procedure started. )        Anesthesia Quick Evaluation

## 2015-06-10 NOTE — Op Note (Signed)
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   Post-operative Diagnosis: Same   Surgeon: Surgeon(s) and Role:    * Jaymes GraffNaima Raunel Dimartino, MD - Primary   Assistants: Venus Standard CNM   Anesthesia: spinal   Procedure Details:  The patient was seen in the Holding Room. The risks, benefits, complications, treatment options, and expected outcomes were discussed with the patient. The patient concurred with the proposed plan, giving informed consent. identified as Kristina Poissonosa Gordon and the procedure verified as C-Section Delivery. A Time Out was held and the above information confirmed.  After induction of anesthesia, the patient was draped and prepped in the usual sterile manner. A transverse incision was made and carried down through the subcutaneous tissue to the fascia. Fascial incision was made in the midline and extended transversely. The fascia was separated from the underlying rectus muscle superiorly and inferiorly. The peritoneum was identified and entered. Peritoneal incision was extended longitudinally with good visualization of bowel and bladder. The utero-vesical peritoneal reflection was incised transversely and the bladder flap was bluntly freed from the lower uterine segment.  An alexsis retractor was placed in the abdomen.   A low transverse uterine incision was made. Delivered from cephalic presentation was a  infant, with Apgar scores of 8 at one minute and 9 at five minutes. Cord ph was sent the umbilical cord was clamped and cut cord blood was obtained for evaluation. The placenta was removed Intact and appeared normal. The uterine outline, tubes and ovaries appeared normal}. The uterine incision was closed with running locked sutures of 0Vicryl. A  second layer 0 vicrlyl was used to imbricate the uterine incision    Hemostasis was observed. Lavage was carried out until clear. The alexsis was removed. There was some oozing from the peritoneum on the back of the right side of the uterus and along the broad ligament.  This was made hemostatic with cautery and  Surgicel.  Surgicel was also placed along the the uterine incision.   The peritoneum was closed with 0 chromic.  The muscles were examined and any bleeders were made hemostatic using bovie cautery device.   The fascia was then reapproximated with running sutures of 0 vicryl.  The subcutaneous tissue was reapproximated  With interrupted stitches using 2-0 plain gut. The subcuticular closure was performed using 3-360monocryl     Instrument, sponge, and needle counts were correct prior the abdominal closure and were correct at the conclusion of the case.    Findings: infant was delivered from vtx presentation. The fluid was clear.  The uterus tubes and ovaries appeared normal.     Estimated Blood Loss: 1000ml   Total IV Fluids: 1500ml   Urine Output: 100CC OF clear urine  Specimens: placenta to pathology  Complications: no complications  Disposition: PACU - hemodynamically stable.   Maternal Condition: stable   Baby condition / location:  Couplet care / Skin to Skin  Attending Attestation: I performed the procedure. I was called back into the room to evaluate vaginal bleeding.  About 250  Cc clot was removed and uterus firmed with fundal massage  Signed: Surgeon(s): Jaymes GraffNaima Daizee Firmin, MD

## 2015-06-10 NOTE — Transfer of Care (Signed)
Immediate Anesthesia Transfer of Care Note  Patient: Kristina Gordon  Procedure(s) Performed: Procedure(s): CESAREAN SECTION (N/A)  Patient Location: PACU  Anesthesia Type:Spinal  Level of Consciousness: awake, alert  and oriented  Airway & Oxygen Therapy: Patient Spontanous Breathing  Post-op Assessment: Report given to RN and Post -op Vital signs reviewed and stable  Post vital signs: Reviewed and stable  Last Vitals:  Filed Vitals:   06/10/15 1110 06/10/15 1121  BP: 167/95 164/92  Pulse: 80 81  Temp:    Resp:  16    Complications: No apparent anesthesia complications

## 2015-06-11 ENCOUNTER — Encounter (HOSPITAL_COMMUNITY): Payer: Self-pay | Admitting: Obstetrics and Gynecology

## 2015-06-11 LAB — COMPREHENSIVE METABOLIC PANEL
ALBUMIN: 2 g/dL — AB (ref 3.5–5.0)
ALK PHOS: 87 U/L (ref 38–126)
ALT: 113 U/L — ABNORMAL HIGH (ref 14–54)
AST: 134 U/L — AB (ref 15–41)
Anion gap: 9 (ref 5–15)
BILIRUBIN TOTAL: 0.8 mg/dL (ref 0.3–1.2)
BUN: 14 mg/dL (ref 6–20)
CALCIUM: 6.7 mg/dL — AB (ref 8.9–10.3)
CO2: 21 mmol/L — ABNORMAL LOW (ref 22–32)
Chloride: 97 mmol/L — ABNORMAL LOW (ref 101–111)
Creatinine, Ser: 0.83 mg/dL (ref 0.44–1.00)
GFR calc Af Amer: >60 mL/min (ref >60)
GFR calc non Af Amer: >60 mL/min (ref >60)
Glucose, Bld: 88 mg/dL (ref 65–99)
Potassium: 4.2 mmol/L (ref 3.5–5.1)
Sodium: 127 mmol/L — ABNORMAL LOW (ref 135–145)
TOTAL PROTEIN: 4.5 g/dL — AB (ref 6.5–8.1)

## 2015-06-11 LAB — CBC
HEMATOCRIT: 25.9 % — AB (ref 36.0–46.0)
HEMATOCRIT: 24.7 % — AB (ref 36.0–46.0)
HEMOGLOBIN: 8.7 g/dL — AB (ref 12.0–15.0)
Hemoglobin: 9.1 g/dL — ABNORMAL LOW (ref 12.0–15.0)
MCH: 32.2 pg (ref 26.0–34.0)
MCH: 32.1 pg (ref 26.0–34.0)
MCHC: 35.1 g/dL (ref 30.0–36.0)
MCHC: 35.2 g/dL (ref 30.0–36.0)
MCV: 91.5 fL (ref 78.0–100.0)
MCV: 91.1 fL (ref 78.0–100.0)
PLATELETS: 69 10*3/uL — AB (ref 150–400)
Platelets: 75 10*3/uL — ABNORMAL LOW (ref 150–400)
RBC: 2.83 MIL/uL — ABNORMAL LOW (ref 3.87–5.11)
RBC: 2.71 MIL/uL — ABNORMAL LOW (ref 3.87–5.11)
RDW: 14.2 % (ref 11.5–15.5)
RDW: 14.5 % (ref 11.5–15.5)
WBC: 15.9 10*3/uL — ABNORMAL HIGH (ref 4.0–10.5)
WBC: 16.4 10*3/uL — ABNORMAL HIGH (ref 4.0–10.5)

## 2015-06-11 LAB — LACTATE DEHYDROGENASE: LDH: 465 U/L — ABNORMAL HIGH (ref 98–192)

## 2015-06-11 LAB — MAGNESIUM
Magnesium: 5.2 mg/dL — ABNORMAL HIGH (ref 1.7–2.4)
Magnesium: 5.4 mg/dL — ABNORMAL HIGH (ref 1.7–2.4)

## 2015-06-11 LAB — URIC ACID: Uric Acid, Serum: 6.6 mg/dL (ref 2.3–6.6)

## 2015-06-11 NOTE — Lactation Note (Signed)
This note was copied from the chart of Kristina Gordon. Lactation Consultation Note  Initial visit made.  Breastfeeding consultation services and support information given to patient.  FOB is present to interpret.  Mom states she doesn't have any milk so formula feeding.  Teaching done and explained colostrum is present now and volume of milk will increase between day 3-5.  Colostrum easily hand expressed and shown to mom.  Assisted with positioning baby in football hold.  Baby somewhat sleepy but did open and latch for 10 minutes.  Baby needed good stimulation.  Stressed importance of putting baby to breast prior to formula.  Instructed to call for assist prn.  Report given to RN.  Patient Name: Kristina Doneen PoissonRosa Gordon ZOXWR'UToday's Date: 06/11/2015 Reason for consult: Initial assessment;Infant < 6lbs   Maternal Data Formula Feeding for Exclusion: Yes Reason for exclusion: Mother's choice to formula and breast feed on admission Has patient been taught Hand Expression?: Yes Does the patient have breastfeeding experience prior to this delivery?: No  Feeding Feeding Type: Breast Fed Nipple Type: Slow - flow Length of feed: 10 min  LATCH Score/Interventions Latch: Repeated attempts needed to sustain latch, nipple held in mouth throughout feeding, stimulation needed to elicit sucking reflex. Intervention(s): Adjust position;Assist with latch;Breast massage;Breast compression  Audible Swallowing: A few with stimulation Intervention(s): Alternate breast massage;Hand expression;Skin to skin  Type of Nipple: Everted at rest and after stimulation  Comfort (Breast/Nipple): Soft / non-tender     Hold (Positioning): Assistance needed to correctly position infant at breast and maintain latch. Intervention(s): Breastfeeding basics reviewed;Support Pillows;Position options;Skin to skin  LATCH Score: 7  Lactation Tools Discussed/Used     Consult Status Consult Status: Follow-up Date:  06/12/15 Follow-up type: In-patient    Huston FoleyMOULDEN, Monti Jilek S 06/11/2015, 11:49 AM

## 2015-06-11 NOTE — Progress Notes (Signed)
Subjective: Postpartum Day 1: Cesarean Delivery Patient reports tolerating PO.  Foley is still in place.  Denies flatus.  Had a HA and blurry vision but improved.    Objective: Vital signs in last 24 hours: Temp:  [97.2 F (36.2 C)-98.4 F (36.9 C)] 98.3 F (36.8 C) (01/11 0800) Pulse Rate:  [63-97] 80 (01/11 0900) Resp:  [10-21] 18 (01/11 0800) BP: (95-169)/(55-95) 134/75 mmHg (01/11 0900) SpO2:  [95 %-100 %] 99 % (01/11 0900) UOP 100cc/hr  Physical Exam:  General: alert and no distress Lochia: appropriate Uterine Fundus: firm Incision: dressing intact DVT Evaluation: No evidence of DVT seen on physical exam. SCDs are on.   Recent Labs  06/11/15 0150 06/11/15 0545  HGB 9.1* 8.7*  HCT 25.9* 24.7*    Assessment/Plan: Status post Cesarean section. Postoperative course complicated by HELLP Syndrome doing well on Mg Sulfate . Continue current care. SCDs for DVT prophylaxis Will d/c mg at 3p. Repeat labs in am.  Labs this am impoving LFTs and plts. UOP good.  Brienna Bass Y 06/11/2015, 10:31 AM

## 2015-06-12 LAB — COMPREHENSIVE METABOLIC PANEL
ALK PHOS: 87 U/L (ref 38–126)
ALT: 82 U/L — ABNORMAL HIGH (ref 14–54)
ANION GAP: 8 (ref 5–15)
AST: 71 U/L — ABNORMAL HIGH (ref 15–41)
Albumin: 2.1 g/dL — ABNORMAL LOW (ref 3.5–5.0)
BILIRUBIN TOTAL: 0.2 mg/dL — AB (ref 0.3–1.2)
BUN: 20 mg/dL (ref 6–20)
CALCIUM: 7.5 mg/dL — AB (ref 8.9–10.3)
CO2: 22 mmol/L (ref 22–32)
Chloride: 104 mmol/L (ref 101–111)
Creatinine, Ser: 0.87 mg/dL (ref 0.44–1.00)
GFR calc non Af Amer: >60 mL/min (ref >60)
Glucose, Bld: 98 mg/dL (ref 65–99)
Potassium: 4.3 mmol/L (ref 3.5–5.1)
SODIUM: 134 mmol/L — AB (ref 135–145)
TOTAL PROTEIN: 4.5 g/dL — AB (ref 6.5–8.1)

## 2015-06-12 LAB — CBC
HCT: 23.7 % — ABNORMAL LOW (ref 36.0–46.0)
HEMOGLOBIN: 7.9 g/dL — AB (ref 12.0–15.0)
MCH: 32.4 pg (ref 26.0–34.0)
MCHC: 33.3 g/dL (ref 30.0–36.0)
MCV: 97.1 fL (ref 78.0–100.0)
Platelets: 127 10*3/uL — ABNORMAL LOW (ref 150–400)
RBC: 2.44 MIL/uL — AB (ref 3.87–5.11)
RDW: 15.1 % (ref 11.5–15.5)
WBC: 20.3 10*3/uL — AB (ref 4.0–10.5)

## 2015-06-12 LAB — CBC WITH DIFFERENTIAL/PLATELET
Basophils Absolute: 0 10*3/uL (ref 0.0–0.1)
Basophils Relative: 0 %
EOS ABS: 0.1 10*3/uL (ref 0.0–0.7)
Eosinophils Relative: 0 %
HEMATOCRIT: 23.1 % — AB (ref 36.0–46.0)
HEMOGLOBIN: 8 g/dL — AB (ref 12.0–15.0)
LYMPHS ABS: 2.3 10*3/uL (ref 0.7–4.0)
Lymphocytes Relative: 11 %
MCH: 32.4 pg (ref 26.0–34.0)
MCHC: 34.6 g/dL (ref 30.0–36.0)
MCV: 93.5 fL (ref 78.0–100.0)
MONOS PCT: 4 %
Monocytes Absolute: 0.8 10*3/uL (ref 0.1–1.0)
NEUTROS PCT: 85 %
Neutro Abs: 17 10*3/uL — ABNORMAL HIGH (ref 1.7–7.7)
Platelets: 102 10*3/uL — ABNORMAL LOW (ref 150–400)
RBC: 2.47 MIL/uL — ABNORMAL LOW (ref 3.87–5.11)
RDW: 15.2 % (ref 11.5–15.5)
WBC: 20.2 10*3/uL — ABNORMAL HIGH (ref 4.0–10.5)

## 2015-06-12 LAB — LACTATE DEHYDROGENASE: LDH: 341 U/L — ABNORMAL HIGH (ref 98–192)

## 2015-06-12 LAB — PROTEIN / CREATININE RATIO, URINE
Creatinine, Urine: 13 mg/dL
PROTEIN CREATININE RATIO: 1 mg/mg{creat} — AB (ref 0.00–0.15)
Total Protein, Urine: 13 mg/dL

## 2015-06-12 LAB — URIC ACID: URIC ACID, SERUM: 6.5 mg/dL (ref 2.3–6.6)

## 2015-06-12 NOTE — Progress Notes (Addendum)
Kristina Gordon [id number] Postpartum Day 3 S/P Primary Cesarean Section due to PreEclampsia with HELLP syndrome and Remote from Delivery  Subjective: Patient up and ambulating within room. FOB acting as interpreter and denies syncope or dizziness. Reports consuming regular diet without issues and denies N/V.  Denies issues with urination, bowel movements, headache, visual disturbances, and abdominal pain. Questions regarding medications for discharge.      Objective: Temp:  [98.2 F (36.8 C)-98.7 F (37.1 C)] 98.2 F (36.8 C) (01/13 0734) Pulse Rate:  [81-109] 97 (01/13 0958) Resp:  [15-18] 16 (01/13 0958) BP: (137-171)/(74-87) 142/80 mmHg (01/13 0958) SpO2:  [98 %-100 %] 98 % (01/13 0957)   Recent Labs  06/12/15 0600 06/12/15 2011 06/13/15 0535  HGB 8.0* 7.9* 8.1*  HCT 23.1* 23.7* 24.1*  WBC 20.2* 20.3* 18.3*    Physical Exam:  General: alert, cooperative and no distress Mood/Affect: Appropriate/appropriate Lungs: clear to auscultation, no wheezes, rales or rhonchi, symmetric air entry.  Heart: normal rate and regular rhythm. Breast: not examined. Abdomen:  + bowel sounds, Soft, Appropriately Tender, Mild Distention, but without tympany Incision: no significant drainage on Honeycomb dressing  Uterine Fundus: firm, U/-2 Lochia: appropriate Skin: Warm, Dry. DVT Evaluation: No evidence of DVT seen on physical exam. Calf/Ankle edema is present. JP drain:   None  Assessment Post Operative Day 3 S/P Primary C/S Normal Involution Elevated BP S/P MgSO4  Plan: -Discussed implementation of procardia into medication regime -Q/C addressed and side effects of medication discussed -Discharge instructions given including medications, who and when to call for concerns, bleeding, when to call, who to call, and follow up care (Home health nurse visit and office visits). -Give procardia now and monitor x 5 hrs -BP Q 2 hrs until discharge -Continue other mgmt as  ordered -Dr. Sallyanne HaversEK to be updated on patient status  Cherre RobinsJessica L Emly MSN, CNM 06/13/2015, 9:53 AM  Addendum: Dr. Sallyanne HaversEK consulted regarding bp s/p procardia Continue to monitor and reassess at 1800 Orders placed for continued observation including BP at 1600 and 1730 Give 500mL bolus now if IV still in place and obtain BP after  Cherre RobinsJessica L Emly MSN, CNM 06/13/2015 3:07 PM   I saw and examined patient and agree with above findings assessment and plan. Patient's husband in room and providing help in translation. I also personally speak medical Spanish. Patient denies any chest pain or shortness of breath or palpitations. She denies lightheadedness or dizziness. She would like to go home today if possible. My general assessment showed patient to be in no acute distress. Her heart and lungs exams sounded normal. My impression is postop day 3 after a C-section, delivery complicated by HELLP syndrome with improving platelet count, tachycardia of unclear etiology, BPs improving with Procardia use. Continue to monitor patient for more hours, checking blood pressure and pulse. Patient's IV site is not working therefore will give pitcher of water. Consider discharge later today if patient remains stable.  Dr. Sallye OberKulwa.

## 2015-06-12 NOTE — Lactation Note (Signed)
This note was copied from the chart of Kristina Argentinaosa Gordon. Lactation Consultation Note  Mom currently breastfeeding and baby nursing actively.  Breasts soft.  Mom asking when milk will come in.  Informed volume should increase in next 24-48 hours.  Reminded to put baby to breast each feeding before giving formula.    Patient Name: Kristina Gordon ZOXWR'UToday's Date: 06/12/2015 Reason for consult: Follow-up assessment   Maternal Data    Feeding Feeding Type: Breast Fed  LATCH Score/Interventions Latch: Grasps breast easily, tongue down, lips flanged, rhythmical sucking. Intervention(s): Breast massage  Audible Swallowing: A few with stimulation Intervention(s): Alternate breast massage  Type of Nipple: Everted at rest and after stimulation  Comfort (Breast/Nipple): Soft / non-tender     Hold (Positioning): No assistance needed to correctly position infant at breast.  LATCH Score: 9  Lactation Tools Discussed/Used     Consult Status Date: 06/13/15 Follow-up type: In-patient    Huston FoleyMOULDEN, Esau Fridman S 06/12/2015, 4:41 PM

## 2015-06-12 NOTE — Addendum Note (Signed)
Addendum  created 06/12/15 2000 by Karie SchwalbeMary Lucindia Lemley, MD   Modules edited: Orders, PRL Based Order Sets

## 2015-06-13 ENCOUNTER — Encounter (HOSPITAL_COMMUNITY): Payer: Self-pay | Admitting: *Deleted

## 2015-06-13 DIAGNOSIS — O1410 Severe pre-eclampsia, unspecified trimester: Secondary | ICD-10-CM

## 2015-06-13 DIAGNOSIS — O1420 HELLP syndrome (HELLP), unspecified trimester: Secondary | ICD-10-CM

## 2015-06-13 DIAGNOSIS — Z98891 History of uterine scar from previous surgery: Secondary | ICD-10-CM

## 2015-06-13 LAB — CBC WITH DIFFERENTIAL/PLATELET
BASOS ABS: 0 10*3/uL (ref 0.0–0.1)
BASOS ABS: 0.1 10*3/uL (ref 0.0–0.1)
BASOS PCT: 0 %
Basophils Relative: 0 %
EOS ABS: 0.1 10*3/uL (ref 0.0–0.7)
EOS PCT: 1 %
Eosinophils Absolute: 0.2 10*3/uL (ref 0.0–0.7)
Eosinophils Relative: 1 %
HCT: 24.1 % — ABNORMAL LOW (ref 36.0–46.0)
HEMATOCRIT: 24.5 % — AB (ref 36.0–46.0)
HEMOGLOBIN: 8.1 g/dL — AB (ref 12.0–15.0)
Hemoglobin: 8.1 g/dL — ABNORMAL LOW (ref 12.0–15.0)
LYMPHS ABS: 2.4 10*3/uL (ref 0.7–4.0)
LYMPHS PCT: 12 %
Lymphocytes Relative: 13 %
Lymphs Abs: 2.5 10*3/uL (ref 0.7–4.0)
MCH: 32.3 pg (ref 26.0–34.0)
MCH: 31.9 pg (ref 26.0–34.0)
MCHC: 33.6 g/dL (ref 30.0–36.0)
MCHC: 33.1 g/dL (ref 30.0–36.0)
MCV: 96 fL (ref 78.0–100.0)
MCV: 96.5 fL (ref 78.0–100.0)
MONO ABS: 0.7 10*3/uL (ref 0.1–1.0)
Monocytes Absolute: 0.8 10*3/uL (ref 0.1–1.0)
Monocytes Relative: 4 %
Monocytes Relative: 3 %
NEUTROS ABS: 17.7 10*3/uL — AB (ref 1.7–7.7)
NEUTROS PCT: 82 %
Neutro Abs: 15 10*3/uL — ABNORMAL HIGH (ref 1.7–7.7)
Neutrophils Relative %: 84 %
PLATELETS: 132 10*3/uL — AB (ref 150–400)
PLATELETS: 185 10*3/uL (ref 150–400)
RBC: 2.51 MIL/uL — AB (ref 3.87–5.11)
RBC: 2.54 MIL/uL — ABNORMAL LOW (ref 3.87–5.11)
RDW: 15.4 % (ref 11.5–15.5)
RDW: 15.4 % (ref 11.5–15.5)
WBC: 18.3 10*3/uL — AB (ref 4.0–10.5)
WBC: 21.2 10*3/uL — ABNORMAL HIGH (ref 4.0–10.5)

## 2015-06-13 LAB — COMPREHENSIVE METABOLIC PANEL
ALBUMIN: 2.3 g/dL — AB (ref 3.5–5.0)
ALBUMIN: 2.6 g/dL — AB (ref 3.5–5.0)
ALK PHOS: 106 U/L (ref 38–126)
ALK PHOS: 127 U/L — AB (ref 38–126)
ALT: 133 U/L — AB (ref 14–54)
ALT: 161 U/L — ABNORMAL HIGH (ref 14–54)
ANION GAP: 11 (ref 5–15)
AST: 145 U/L — AB (ref 15–41)
AST: 171 U/L — AB (ref 15–41)
Anion gap: 7 (ref 5–15)
BILIRUBIN TOTAL: 0.5 mg/dL (ref 0.3–1.2)
BUN: 20 mg/dL (ref 6–20)
BUN: 20 mg/dL (ref 6–20)
CALCIUM: 8.2 mg/dL — AB (ref 8.9–10.3)
CALCIUM: 8.9 mg/dL (ref 8.9–10.3)
CHLORIDE: 106 mmol/L (ref 101–111)
CO2: 26 mmol/L (ref 22–32)
CO2: 23 mmol/L (ref 22–32)
CREATININE: 0.72 mg/dL (ref 0.44–1.00)
Chloride: 104 mmol/L (ref 101–111)
Creatinine, Ser: 0.72 mg/dL (ref 0.44–1.00)
GFR calc Af Amer: >60 mL/min (ref >60)
GFR calc Af Amer: >60 mL/min (ref >60)
GFR calc non Af Amer: >60 mL/min (ref >60)
GLUCOSE: 81 mg/dL (ref 65–99)
GLUCOSE: 117 mg/dL — AB (ref 65–99)
Potassium: 4.5 mmol/L (ref 3.5–5.1)
Potassium: 4.1 mmol/L (ref 3.5–5.1)
SODIUM: 139 mmol/L (ref 135–145)
Sodium: 138 mmol/L (ref 135–145)
TOTAL PROTEIN: 5.9 g/dL — AB (ref 6.5–8.1)
Total Bilirubin: 0.4 mg/dL (ref 0.3–1.2)
Total Protein: 5.1 g/dL — ABNORMAL LOW (ref 6.5–8.1)

## 2015-06-13 LAB — URINALYSIS, ROUTINE W REFLEX MICROSCOPIC
BILIRUBIN URINE: NEGATIVE
GLUCOSE, UA: NEGATIVE mg/dL
KETONES UR: NEGATIVE mg/dL
Leukocytes, UA: NEGATIVE
NITRITE: NEGATIVE
PH: 8 (ref 5.0–8.0)
Protein, ur: NEGATIVE mg/dL
SPECIFIC GRAVITY, URINE: 1.02 (ref 1.005–1.030)

## 2015-06-13 LAB — URINE MICROSCOPIC-ADD ON
RBC / HPF: NONE SEEN RBC/hpf (ref 0–5)
SQUAMOUS EPITHELIAL / LPF: NONE SEEN
WBC, UA: NONE SEEN WBC/hpf (ref 0–5)

## 2015-06-13 LAB — TSH: TSH: 6.086 u[IU]/mL — ABNORMAL HIGH (ref 0.350–4.500)

## 2015-06-13 LAB — URIC ACID: Uric Acid, Serum: 6.7 mg/dL — ABNORMAL HIGH (ref 2.3–6.6)

## 2015-06-13 LAB — LACTATE DEHYDROGENASE: LDH: 342 U/L — ABNORMAL HIGH (ref 98–192)

## 2015-06-13 MED ORDER — LACTATED RINGERS IV BOLUS (SEPSIS): 500.0000 mL | Freq: Once | INTRAVENOUS | Status: DC

## 2015-06-13 MED ORDER — OXYCODONE-ACETAMINOPHEN 5-325 MG PO TABS: 1.0000 | ORAL_TABLET | ORAL | Status: DC | PRN

## 2015-06-13 MED ORDER — NIFEDIPINE ER 30 MG PO TB24: 30.0000 mg | ORAL_TABLET | Freq: Every day | ORAL | Status: DC

## 2015-06-13 MED ORDER — NIFEDIPINE ER OSMOTIC RELEASE 30 MG PO TB24
30.0000 mg | ORAL_TABLET | Freq: Every day | ORAL | Status: DC
  Administered 2015-06-13: 30 mg via ORAL
  Filled 2015-06-13 (×2): qty 1

## 2015-06-13 MED ORDER — FERROUS SULFATE 325 (65 FE) MG PO TABS: 325.0000 mg | ORAL_TABLET | Freq: Two times a day (BID) | ORAL | Status: DC

## 2015-06-13 MED ORDER — IBUPROFEN 600 MG PO TABS: 600.0000 mg | ORAL_TABLET | Freq: Four times a day (QID) | ORAL | Status: DC | PRN

## 2015-06-13 NOTE — Progress Notes (Signed)
Kristina Gordon [id number] Postpartum Day 2 S/P Primary Cesarean Section due to PreEclampsia with HELLP syndrome and Remote from Delivery  Subjective: Patient up and ambulating within room. FOB acting as interpreter and denies syncope or dizziness. Reports consuming regular diet without issues and denies N/V. Patient reports 2 bowel movements and is passing flatus. Denies issues with urination, headache, visual disturbances, but reports incisional and abdominal pain. Questions regarding preeclampsia in pregnancy and lab values. Also expresses desire for discharge today. Pain is being managed with use of motrin.   Objective: Temp: [97.8 F (36.6 C)-98.7 F (37.1 C)] 98.7 F (37.1 C) (01/12 0802) Pulse Rate: [82-112] 97 (01/12 0802) Resp: [12-18] 15 (01/12 0802) BP: (130-152)/(78-83) 130/83 mmHg (01/12 0802) SpO2: [96 %-99 %] 99 % (01/12 0539) Weight: [56.926 kg (125 lb 8 oz)] 56.926 kg (125 lb 8 oz) (01/11 1620)   Recent Labs (last 2 labs)      Recent Labs  06/11/15 0150 06/11/15 0545 06/12/15 0600  HGB 9.1* 8.7* 8.0*  HCT 25.9* 24.7* 23.1*  WBC 15.9* 16.4* 20.2*     Physical Exam:  General: alert, cooperative and no distress Mood/Affect: Appropriate/appropriate Lungs: clear to auscultation, no wheezes, rales or rhonchi, symmetric air entry.  Heart: normal rate and regular rhythm. Breast: not examined. Abdomen: + bowel sounds, Soft, Appropriately Tender, Mild Distention, but without tympany Incision: no significant drainage on Honeycomb dressing  Uterine Fundus: firm, U/-2 Lochia: appropriate Skin: Warm, Dry. DVT Evaluation: No evidence of DVT seen on physical exam. Calf/Ankle edema is present. JP drain: None  Assessment Post Operative Day 2 S/P Primary C/S Normal Involution Hemodynamically Stable/Improving S/P MgSO4  Plan: -Discussed preeclampsia in pregnancy setting -Informed that some elevated BP still noted and may need to  start oral anti-hypertensives -Informed that discharge today is unlikely as we would like to continue to observe -Labs improving -Continue other mgmt as ordered -Dr. Lynford HumphreySR to be updated on patient status  Cherre RobinsJessica L Davonne Jarnigan MSN, CNM 06/12/2015, 11:05 AM   Addendum Dr. Lynford HumphreySR updated and advised Continued observation throughout the day and night Plan for discharge tomorrow Consider home nurse or office visit one week s/p discharge for bp check  Cherre RobinsJessica L Aneesah Hernan MSN, CNM 06/12/2015 1:23 PM

## 2015-06-13 NOTE — Discharge Summary (Signed)
Cesarean Section Delivery Discharge Summary  Kristina Gordon  DOB:    01/01/1981 MRN:    [id number] CSN:    [id number]  Date of admission:                  Jun 09, 2015  Date of discharge:                   Jun 13, 2015  Procedures this admission: Primary C/S for Severe PreEclampsia and HELLP Syndrome after SROM  Date of Delivery: Jun 10, 2015  Newborn Data:  Live born female  Birth Weight: 5 lb 10.3 oz (2560 g) APGAR: 8, 9  Home with mother. Name: Salvador Circumcision Plan: None  History of Present Illness:  [name] is a 35 y.o. female, G2P1011, who presents at 7968w1d weeks gestation. The patient has been followed at [facility]   Her pregnancy has been complicated by:  Patient Active Problem List   Diagnosis Date Noted  . S/P cesarean section 06/13/2015  . HELLP syndrome 06/13/2015  . Severe preeclampsia 06/13/2015  . Normal labor 06/10/2015  . Language barrier 03/10/2015  . Syncope 03/10/2015  . AMA (advanced maternal age) multigravida 35+ 03/10/2015  . Abnormal findings on antenatal screening of mother -- DS risk 1/53; MFM consult; growth scan monthly; pt declined further testing 03/10/2015  . SS (short stature) 03/10/2015    Hospital Course--Unscheduled Cesarean:  Admitted for SROM. Negative GBS.  Utilized epidural for pain management.    Due to diagnosis and worsening PreEclampsia with onset of HELLP Syndrome, she was consented for cesarean, with Dr. N.Dillard performing a Primary LTCS under spinal epidural anesthesia, with delivery of a viable female, with weight and Apgars as listed below. Infant was in good condition and remained at the patient's bedside.  The patient was taken to recovery in good condition.  Patient planned to breast and bottle feed.  On post-op day 1, patient was doing well, but was on MgSO4 infusion.  After discontinuation of mag, patient tolerating  regular diet, with Hgb of 7.9.  Throughout her stay, her physical exam was WNL, her incision was CDI, and her vital signs remained stable.  By post-op day 3, she was up ad lib, tolerating a regular diet, with good pain control with po med.  She was deemed to have received the full benefit of her hospital stay, and was discharged home in stable condition.  Contraceptive choice was undecided.    Feeding:  breast and bottle  Contraception:  undecided  Hemoglobin Results:  CBC Latest Ref Rng 06/13/2015 06/12/2015 06/12/2015  WBC 4.0 - 10.5 K/uL 18.3(H) 20.3(H) 20.2(H)  Hemoglobin 12.0 - 15.0 g/dL 8.1(L) 7.9(L) 8.0(L)  Hematocrit 36.0 - 46.0 % 24.1(L) 23.7(L) 23.1(L)  Platelets 150 - 400 K/uL 132(L) 127(L) 102(L)     Discharge Physical Exam:   General: alert, cooperative, no distress and pale Mood/Affect: Appropriate Chest: clear to auscultation, no wheezes, rales or rhonchi, symmetric air entry.  CVS exam: normal rate and regular rhythm. Breast exam: not examined. Abdomen:  + bowel sounds, Soft, Mild Tenderness, Mild Distention Uterine Fundus: firm, U/-1 Lochia: appropriate Incision: Honeycomb dressing without significant drainage DVT Evaluation: No evidence of DVT seen on physical exam. No cords or calf tenderness. Calf/Ankle edema is present. Skin: Warm, Dry  Procedures and/or Complications Intrapartum Procedures: cesarean: low cervical, transverse Postpartum Procedures: MgSO4 Complications-Operative and Postpartum: none   Discharge Information: Diagnoses: Term Pregnancy-delivered, Preelampsia  and S/P Primary C/S Activity:  pelvic rest Diet:  routine Medications: Ibuprofen, Iron and Percocet Condition: stable Instructions: Incision care,Remove steri-strips in 7-10 days.  Incision care guidelines: how to clean, when to call, and anticipated healing. Postpartum Depression, Activity Restrictions, Breastfeeding, Pain Mgmt, Who and when to call for PP Concerns, Follow Up  Appointment, Information Sheet Given Hypertension, Postpartum Care after C/S  Discharge to: Home  Follow-up Information    Follow up with [facility]. Schedule an appointment as soon as possible for a visit in 1 week.   Specialty:  Obstetrics and Gynecology   Why:  Please call if you have any questions or concerns, prior to your next visit.   Contact information:   [address] [place] [address] [phone]       Cherre Robins, MSN, CNM 06/13/2015 5:54 PM

## 2015-06-13 NOTE — Discharge Instructions (Signed)
Cuidados en el postparto luego de un parto por cesárea  °(Postpartum Care After Cesarean Delivery) °Después del parto (período de postparto), la estadía normal en el hospital es de 24-72 horas. Si hubo problemas con el trabajo de parto o el parto, o si tiene otros problemas médicos, es posible que deba permanecer en el hospital por más tiempo.  °Mientras esté en el hospital, recibirá ayuda e instrucciones sobre cómo cuidar de usted misma y de su bebé recién nacido durante el postparto.  °Mientras esté en el hospital:  °· Es normal que sienta dolor o molestias en la incisión en el abdomen. Asegúrese de decirle a las enfermeras si siente dolor, así como donde siente el dolor y qué empeora el dolor. °· Si está amamantando, puede sentir contracciones dolorosas en el útero durante algunas semanas. Esto es normal. Las contracciones ayudan a que el útero vuelva a su tamaño normal. °· Es normal tener algo de sangrado después del parto. °· Durante los primeros 1-3 días después del parto, el flujo es de color rojo y la cantidad puede ser similar a un período. °· Es frecuente que el flujo se inicie y se detenga. °· En los primeros días, puede eliminar algunos coágulos pequeños. Informe a las enfermeras si comienza a eliminar coágulos grandes o aumenta el flujo. °· No  elimine los coágulos de sangre por el inodoro antes de que la enfermera los vea. °· Durante los próximos 3 a 10 días después del parto, el flujo debe ser más acuoso y rosado o marrón. °· De diez a catorce días después del parto, el flujo debe ser una pequeña cantidad de secreción de color blanco amarillento. °· La cantidad de flujo disminuirá en las primeras semanas después del parto. El flujo puede detenerse en 6-8 semanas. La mayoría de las mujeres no tienen más flujo a las 12 semanas después del parto. °· Usted debe cambiar sus apósitos con frecuencia. °· Lávese bien las manos con agua y jabón durante al menos 20 segundos después de cambiar el apósito, usar el  baño o antes de sostener o alimentar a su recién nacido. °· Se le quitará la vía intravenosa (IV) cuando ya esté bebiendo suficientes líquidos. °· El tubo de drenaje para la orina (catéter urinario) que se inserta antes del parto puede ser retirado luego de 6-8 horas después del parto o cuando las piernas vuelvan a tener sensibilidad. Usted puede sentir que tiene que vaciar la vejiga durante las primeras 6-8 horas después de que le quiten el catéter. °· Si se siente débil, mareada o se desmaya, llame a su enfermera antes de levantarse de la cama por primera vez y antes de tomar una ducha por primera vez. °· En los primeros días después del parto, podrá sentir las mamas sensibles y llenas. Esto se llama congestión. La sensibilidad en los senos por lo general desaparece dentro de las 48-72 horas después de que ocurre la congestión. También puede notar que la leche se escapa de sus senos. Si no está amamantando no estimule sus pechos. La estimulación de las mamas hace que sus senos produzcan más leche. °· Pasar tanto tiempo como sea posible con el bebé recién nacido es muy importante. Durante este tiempo, usted y su bebé deben sentirse cerca y conocerse uno al otro. Tener al bebé en su habitación (alojamiento conjunto) ayudará a fortalecer el vínculo con el bebé recién nacido. Esto le dará tiempo para conocerlo y atenderlo de manera cómoda. °· Las hormonas se modifican después del parto. A   veces, los cambios hormonales pueden causar tristeza o ganas de llorar por un tiempo. Estos sentimientos no deben durar ms de [employer]. Si duran ms que eso, debe hablar con su mdico.  Si lo desea, hable con su mdico acerca de los mtodos de planificacin familiar o mtodos anticonceptivos.  Hable con su mdico acerca de las vacunas. El mdico puede indicarle que se aplique las siguientes vacunas antes de salir del hospital:  [place] contra el ttanos, la difteria y la tos ferina (Tdap) o el ttanos y la difteria (Td).  Es muy importante que usted y su familia (incluyendo a los abuelos) u otras personas que cuidan al recin nacido estn al da con las vacunas Tdap o Td. Las vacunas Tdap o Td pueden ayudar a proteger al recin nacido de enfermedades.  Inmunizacin contra la rubola.  Inmunizacin contra la varicela.  Inmunizacin contra la gripe. Usted debe recibir esta vacunacin anual si no la ha recibido Academic librarian.   Esta informacin no tiene Theme park manager el consejo del mdico. Asegrese de hacerle al mdico cualquier pregunta que tenga.   Document Released: 05/03/2012 Elsevier Interactive Patient Education 2016 [employer].  Hipertensin (Hypertension) La hipertensin, conocida comnmente como presin arterial alta, se produce cuando la sangre bombea en las arterias con mucha fuerza. Las arterias son los vasos sanguneos que transportan la sangre desde el corazn hacia todas las partes del cuerpo. Una lectura de la presin arterial consiste en un nmero ms alto sobre un nmero ms bajo, por ejemplo, 110/72. El nmero ms alto (presin sistlica) corresponde a la presin interna de las arterias cuando el corazn [place]. El nmero ms bajo (presin diastlica) corresponde a la presin interna de las arterias cuando el corazn se relaja. En condiciones ideales, la presin arterial debe ser inferior a 120/80. La hipertensin fuerza al corazn a trabajar ms para Marine scientist. Las arterias pueden estrecharse o ponerse rgidas. La hipertensin no tratada o no controlada puede causar infarto de miocardio, ictus, enfermedad renal y otros problemas. FACTORES DE RIESGO Algunos factores de riesgo de hipertensin son controlables, pero otros no lo son.  Entre los factores de riesgo que usted no puede Chief Operating Officer, se [employer] siguientes:   La raza. El riesgo es mayor para las Statistician.  La edad. Los riesgos aumentan con la edad.  El sexo. Antes de los 45aos, los  hombres corren ms [employer]. Despus de los 65aos, las mujeres corren ms [employer]. Entre los factores de riesgo que usted puede Chief Operating Officer, se [employer] siguientes:  No hacer la cantidad suficiente de actividad fsica o ejercicio.  Tener sobrepeso.  Consumir mucha grasa, azcar, caloras o sal en la dieta.  Beber alcohol en exceso. SIGNOS Y SNTOMAS Por lo general, la hipertensin no causa signos o sntomas. La hipertensin arterial demasiado alta (crisis hipertensiva) puede causar dolor de cabeza, ansiedad, falta de aire y hemorragia nasal. DIAGNSTICO Para detectar si usted tiene hipertensin, el mdico le medir la presin arterial mientras est sentado, con el brazo levantado a la altura del corazn. Debe medirla al [facility] veces en el mismo brazo. Determinadas condiciones pueden causar una diferencia de presin arterial entre el brazo izquierdo y Aeronautical engineer. El hecho de tener una sola lectura de la presin arterial ms alta que lo normal no significa que Research scientist (physical sciences). Si no est claro si tiene hipertensin arterial, es posible que se le pida que regrese Banker para volver a  controlarle la presin arterial. O bien se le puede pedir que se controle la presin arterial en su casa durante 1 o ms meses. TRATAMIENTO El tratamiento de la hipertensin arterial incluye hacer cambios en el estilo de vida y, posiblemente, tomar medicamentos. Un estilo de vida saludable puede ayudar a bajar la presin arterial alta. Quiz deba cambiar algunos hbitos. Los cambios en el estilo de vida pueden incluir lo siguiente:  Seguir la dieta DASH. Esta dieta tiene un alto contenido de frutas, verduras y Radiation protection practitionercereales integrales. Incluye poca cantidad de sal, carnes rojas y azcares agregados.  Mantenga el consumo de sodio por debajo de 2300 mg por da.  Realizar al BJ's Wholesalemenos entre 30 y 45 minutos de ejercicio Timberlaneaerbico, 4 veces por semana como mnimo.  Perder peso, si es  necesario.  No fumar.  Limitar el consumo de bebidas alcohlicas.  Aprender formas de reducir el estrs. El mdico puede recetarle medicamentos si los cambios en el estilo de vida no son suficientes para Museum/gallery curatorlograr controlar la presin arterial y si una de las siguientes afirmaciones es verdadera:  Cleone Slimiene entre 18 y 9159 aos y su presin arterial sistlica est por encima de 140.  Tiene 60 aos o ms y su presin arterial sistlica est por encima de 150.  Su presin arterial diastlica est por encima de 90.  Tiene diabetes y su presin arterial sistlica est por encima de 140 o su presin arterial diastlica est por encima de 90.  Tiene una enfermedad renal y su presin arterial est por encima de 140/90.  Tiene una enfermedad cardaca y su presin arterial est por encima de 140/90. La presin arterial deseada puede variar en funcin de las enfermedades, la edad y otros factores personales. INSTRUCCIONES PARA EL CUIDADO EN EL HOGAR  Haga que le midan de nuevo la presin arterial segn las indicaciones del mdico.  Tome los medicamentos solamente como se lo haya indicado el mdico. Siga cuidadosamente las indicaciones. Los medicamentos para la presin arterial deben tomarse segn las indicaciones. Los medicamentos pierden eficacia al omitir las dosis. El hecho de omitir las dosis tambin Lesothoaumenta el riesgo de otros problemas.  No fume.  Contrlese la presin arterial en su casa segn las indicaciones del mdico. SOLICITE ATENCIN MDICA SI:   Piensa que tiene una reaccin alrgica a los medicamentos.  Tiene mareos o dolores de cabeza con Naval architectrecurrencia.  Tiene hinchazn en los tobillos.  Tiene problemas de visin. SOLICITE ATENCIN MDICA DE INMEDIATO SI:  Siente un dolor de cabeza intenso o confusin.  Siente debilidad inusual, adormecimiento o que Hospital doctorse desmayar.  Siente dolor intenso en el pecho o en el abdomen.  Vomita repetidas veces.  Tiene dificultad para  respirar. ASEGRESE DE QUE:   Comprende estas instrucciones.  Controlar su afeccin.  Recibir ayuda de inmediato si no mejora o si empeora.   Esta informacin no tiene Theme park managercomo fin reemplazar el consejo del mdico. Asegrese de hacerle al mdico cualquier pregunta que tenga.   Document Released: 05/17/2005 Document Revised: 10/01/2014 Elsevier Interactive Patient Education 2016 ArvinMeritorElsevier Inc. Southern CompanyEleccin del mtodo anticonceptivo (Contraception Choices) La anticoncepcin (control de la natalidad) es el uso de cualquier mtodo o dispositivo para Location managerevitar el embarazo. A continuacin se indican algunos de esos mtodos. ANTICONCEPTIVOS HORMONALES  Un pequeo tubo colocado bajo la piel de la parte superior del brazo (implante). El tubo puede Geneticist, molecularpermanecer en el lugar durante 3 aos. El implante debe quitarse despus de 3 aos.  Inyecciones que se aplican cada 3 meses.  Pldoras que  deben [employer].  Parches que se cambian una vez por semana.  Un anillo que se coloca en la vagina (anillo vaginal). El anillo se deja en su lugar durante 3 semanas y se retira durante 1 semana. Luego se coloca un nuevo anillo en la vagina.  Pldoras para el control de la natalidad despus de Warehouse manager sexo (relaciones sexuales) sin proteccin. ANTICONCEPTIVOS DE Emeline Darling cubierta delgada que se [place] sobe el pene (condn masculino) que se coloca durante las relaciones sexuales.  Una cubierta blanda y suelta que se coloca en la vagina (condn femenino) antes de las relaciones sexuales.  Un dispositivo de goma que se aplica sobre el cuello del tero (diafragma). Este dispositivo debe ser hecho para usted. Se coloca en la vagina antes de Management consultant. Debe dejarlo colocado en la vagina durante 6 a 8 horas despus de las [employer].  Un capuchn pequeo y [employer] se fija sobre el cuello del tero (capuchn cervical). Este capuchn debe ser hecho para usted. Debe dejarlo colocado en la  vagina durante 48 horas despus de las [employer].  Una esponja que se coloca en la vagina antes de Management consultant.  Una sustancia qumica que destruye o impide que los espermatozoides ingresen al cuello y al tero (espermicida). La sustancia qumica puede ser en crema, gel, espuma o pldoras. DISPOSITIVO DE CONTROL INTRAUTERINO (DIU)   El DIU es un pequeo dispositivo plstico en forma de T. Se coloca dentro del tero. Hay dos tipos de DIU:  DIU de cobre. El dispositivo est recubierto en alambre de cobre. El cobre produce un lquido que [employer] espermatozoides. Puede permanecer colocado durante 10 aos.  DIU hormonal. La hormona impide que ocurra el embarazo. Puede permanecer colocado durante 5 aos. MTODOS PERMANENTES  La mujer puede hacerse sellar, ligar u obstruir las trompas de [place] durante [place]. Esto impide que el vulo llegue hasta el tero.  El mdico coloca un alambre diminuto o lo inserta en cada una de las trompas de [place]. Esto produce un tejido cicatrizal que obstruye las trompas de [place].  En el hombre pueden ligarse los conductos por los que pasan los espermatozoides (vasectoma). CONTROL DE LA NATALIDAD POR PLANIFICACIN FAMILIAR NATURAL   La planificacin familiar natural significa no tener Clinical research associate o usar un mtodo anticonceptivo de Warehouse manager en los perodos frtiles de la [place].  Use a un almanaque para llevar un registro de la extensin de cada perodo y para [place] que puede quedar [place].  Evite tener relaciones sexuales durante la ovulacin.  Use un termmetro para medir la Arts development officer. Tambin reconozca los sntomas de la ovulacin.  El momento de [employer] relaciones sexuales debe ser despus de que la mujer [place]. Use condones para protegerse de las enfermedades de transmisin sexual (ETS). Hgalo independientemente del tipo de [employer] use. Hable con su mdico acerca  de cul es el mejor mtodo anticonceptivo para usted.   Esta informacin no tiene Theme park manager el consejo del mdico. Asegrese de hacerle al mdico cualquier pregunta que tenga.   Document Released: 09/01/2010 Document Revised: 05/22/2013 Elsevier Interactive Patient Education [employer]

## 2015-06-13 NOTE — Progress Notes (Signed)
Antepartum LOS: 4 Kristina Gordon, 35 y.o.,   OB History    Gravida Para Term Preterm AB TAB SAB Ectopic Multiple Living   2 1 1  1  0 1 0 0 1      Subjective -Patient reports palpitations and regards to procardia.  Patient denies SOB, dizziness, and n/v.  Patient does report perception of palpitations.   Objective  Filed Vitals:   06/13/15 1403 06/13/15 1607 06/13/15 1814 06/13/15 1821  BP: 146/81 146/85 141/84   Pulse: 112 111 129 124  Temp:  98.2 F (36.8 C)    TempSrc:      Resp: 16 16 16    Height:      Weight:      SpO2: 99% 100% 99%     Results for orders placed or performed during the hospital encounter of 06/09/15 (from the past 24 hour(s))  CBC     Status: Abnormal   Collection Time: 06/12/15  8:11 PM  Result Value Ref Range   WBC 20.3 (H) 4.0 - 10.5 K/uL   RBC 2.44 (L) 3.87 - 5.11 MIL/uL   Hemoglobin 7.9 (L) 12.0 - 15.0 g/dL   HCT 16.1 (L) 09.6 - 04.5 %   MCV 97.1 78.0 - 100.0 fL   MCH 32.4 26.0 - 34.0 pg   MCHC 33.3 30.0 - 36.0 g/dL   RDW 40.9 81.1 - 91.4 %   Platelets 127 (L) 150 - 400 K/uL  CBC with Differential/Platelet     Status: Abnormal   Collection Time: 06/13/15  5:35 AM  Result Value Ref Range   WBC 18.3 (H) 4.0 - 10.5 K/uL   RBC 2.51 (L) 3.87 - 5.11 MIL/uL   Hemoglobin 8.1 (L) 12.0 - 15.0 g/dL   HCT 78.2 (L) 95.6 - 21.3 %   MCV 96.0 78.0 - 100.0 fL   MCH 32.3 26.0 - 34.0 pg   MCHC 33.6 30.0 - 36.0 g/dL   RDW 08.6 57.8 - 46.9 %   Platelets 132 (L) 150 - 400 K/uL   Neutrophils Relative % 82 %   Neutro Abs 15.0 (H) 1.7 - 7.7 K/uL   Lymphocytes Relative 13 %   Lymphs Abs 2.4 0.7 - 4.0 K/uL   Monocytes Relative 4 %   Monocytes Absolute 0.8 0.1 - 1.0 K/uL   Eosinophils Relative 1 %   Eosinophils Absolute 0.1 0.0 - 0.7 K/uL   Basophils Relative 0 %   Basophils Absolute 0.0 0.0 - 0.1 K/uL  Comprehensive metabolic panel     Status: Abnormal   Collection Time: 06/13/15  5:35 AM  Result Value Ref Range   Sodium 139 135 - 145 mmol/L    Potassium 4.5 3.5 - 5.1 mmol/L   Chloride 106 101 - 111 mmol/L   CO2 26 22 - 32 mmol/L   Glucose, Bld 81 65 - 99 mg/dL   BUN 20 6 - 20 mg/dL   Creatinine, Ser 6.29 0.44 - 1.00 mg/dL   Calcium 8.2 (L) 8.9 - 10.3 mg/dL   Total Protein 5.1 (L) 6.5 - 8.1 g/dL   Albumin 2.3 (L) 3.5 - 5.0 g/dL   AST 528 (H) 15 - 41 U/L   ALT 133 (H) 14 - 54 U/L   Alkaline Phosphatase 106 38 - 126 U/L   Total Bilirubin 0.4 0.3 - 1.2 mg/dL   GFR calc non Af Amer >60 >60 mL/min   GFR calc Af Amer >60 >60 mL/min   Anion gap 7 5 - 15  Lactate  dehydrogenase     Status: Abnormal   Collection Time: 06/13/15  5:35 AM  Result Value Ref Range   LDH 342 (H) 98 - 192 U/L  Uric acid     Status: Abnormal   Collection Time: 06/13/15  5:35 AM  Result Value Ref Range   Uric Acid, Serum 6.7 (H) 2.3 - 6.6 mg/dL    Meds: Scheduled Meds: . ferrous sulfate  325 mg Oral BID WC  . ibuprofen  600 mg Oral 4 times per day  . lactated ringers  500 mL Intravenous Once  . measles, mumps and rubella vaccine  0.5 mL Subcutaneous Once  . NIFEdipine  30 mg Oral Daily  . prenatal multivitamin  1 tablet Oral Q1200  . scopolamine  1 patch Transdermal Once  . senna-docusate  2 tablet Oral Q24H  . simethicone  80 mg Oral TID PC  . simethicone  80 mg Oral Q24H  . Tdap  0.5 mL Intramuscular Once   Continuous Infusions: . lactated ringers 125 mL/hr at 06/11/15 1011  . naLOXone [facility](NARCAN) adult infusion for PRURITIS     PRN Meds:.acetaminophen, bisacodyl, witch hazel-glycerin **AND** dibucaine, diphenhydrAMINE **OR** diphenhydrAMINE, diphenhydrAMINE, hydrALAZINE, labetalol, lanolin, menthol-cetylpyridinium, nalbuphine **OR** nalbuphine, nalbuphine **OR** nalbuphine, naLOXone (NARCAN) adult infusion for PRURITIS, naloxone **AND** sodium chloride, ondansetron (ZOFRAN) IV, oxyCODONE-acetaminophen, oxyCODONE-acetaminophen, simethicone, sodium phosphate, zolpidem   Physical Exam:   Assessment S/P Primary C/S Tachycardia Elevated  BP  Plan Per Dr. Sallyanne HaversEK; Cancel discharge Observe throughout the night CBC and TSH stat EKG now Follow up as appropriate  Cherre RobinsJessica L Rileigh Kawashima, MSN, CNM 06/13/2015, 6:47 PM

## 2015-06-13 NOTE — Addendum Note (Signed)
Addendum  created 06/13/15 0706 by Cristela BlueKyle Zaylah Blecha, MD   Modules edited: Clinical Notes   Clinical Notes:  File: [id number]

## 2015-06-13 NOTE — Progress Notes (Signed)
IV access d/c'd due to being occluded. Dr Sallye OberKulwa made aware. Pt given a pitcher of water to drink. BP check at 1600.

## 2015-06-13 NOTE — Lactation Note (Signed)
This note was copied from the chart of Kristina Argentinaosa Gordon. Lactation Consultation Note; Dad translated for me. Reports baby fed about 1 hour ago for 20 min. Mom reports some pain with initial latch but eases off after a few minutes. Has been giving several bottles of formula. Asking about milk supply increasing. Reviewed hand expression with mom and easily able to hand express Colostrum. Mom pleased with seeing some Colostrum. Manual pump given with instructions for use and cleaning. Mom tried it and again saw some Colostrum on other breast. Encouragement given. Suggested putting baby to the breast every feeding to promote milk supply, then giving small amounts of formula if baby is still hungry. As milk supply increases will not need formula. Attempted to latch baby, he licked Colostrum off, took a few sucks then off tp sleep. Reviewed positions for breastfeeding with parents.No questions at present. To call prn  Patient Name: Kristina Doneen PoissonRosa Gordon NWGNF'AToday's Date: 06/13/2015 Reason for consult: Follow-up assessment   Maternal Data Formula Feeding for Exclusion: Yes Reason for exclusion: Mother's choice to formula and breast feed on admission Has patient been taught Hand Expression?: Yes Does the patient have breastfeeding experience prior to this delivery?: No  Feeding Feeding Type: Breast Fed Length of feed: 20 min  LATCH Score/Interventions Latch: Too sleepy or reluctant, no latch achieved, no sucking elicited.  Audible Swallowing: None  Type of Nipple: Everted at rest and after stimulation  Comfort (Breast/Nipple): Soft / non-tender     Hold (Positioning): Assistance needed to correctly position infant at breast and maintain latch. Intervention(s): Breastfeeding basics reviewed;Position options  LATCH Score: 5  Lactation Tools Discussed/Used WIC Program: No Pump Review: Setup, frequency, and cleaning Initiated by:: DW Date initiated:: 06/13/15   Consult  Status Consult Status: Complete    Pamelia HoitWeeks, Tarun Patchell D 06/13/2015, 11:35 AM

## 2015-06-14 LAB — T4, FREE: FREE T4: 0.73 ng/dL (ref 0.61–1.12)

## 2015-06-14 NOTE — Progress Notes (Signed)
Kristina Gordon [id number] Postpartum Day 4 S/P Primary Cesarean Section due to PreEclampsia with HELLP syndrome and Remote from Delivery  Subjective: Patient resting comfortably and feeling much better this am.  She states that yesterday, she had heart palpitations, but today that has resolved.  She reports no CP/SOB or palpitations.  Ambulating without difficulty.  +flatus, +BM.  No nausea/vomiting, tolerating gen diet.  No headache, no dizziness, no visual disturbances  Objective: Temp:  [97.9 F (36.6 C)-98.5 F (36.9 C)] 97.9 F (36.6 C) (01/14 0817) Pulse Rate:  [86-139] 97 (01/14 0817) Resp:  [16-18] 17 (01/14 0817) BP: (131-157)/(70-87) 150/83 mmHg (01/14 0817) SpO2:  [98 %-100 %] 100 % (01/14 0817)  Physical Exam:  General: alert, cooperative and no distress Mood/Affect: Appropriate/appropriate Lungs: clear to auscultation, no wheezes, rales or rhonchi, symmetric air entry.  Heart: normal rate and regular rhythm- no tachycardia noted today Breast: not examined. Abdomen:  + bowel sounds, Soft, non-tender, no distension Incision: no significant drainage on Honeycomb dressing, appears C/D/I Uterine Fundus: firm, non-tender below umbilicus Lochia: appropriate Skin: Warm, Dry. DVT Evaluation: No evidence of DVT seen on physical exam. 1+ pitting edema present, no clonus  Results for orders placed or performed during the hospital encounter of 06/09/15 (from the past 24 hour(s))  CBC with Differential/Platelet     Status: Abnormal   Collection Time: 06/13/15  7:11 PM  Result Value Ref Range   WBC 21.2 (H) 4.0 - 10.5 K/uL   RBC 2.54 (L) 3.87 - 5.11 MIL/uL   Hemoglobin 8.1 (L) 12.0 - 15.0 g/dL   HCT 40.924.5 (L) 81.136.0 - 91.446.0 %   MCV 96.5 78.0 - 100.0 fL   MCH 31.9 26.0 - 34.0 pg   MCHC 33.1 30.0 - 36.0 g/dL   RDW 78.215.4 95.611.5 - 21.315.5 %   Platelets 185 150 - 400 K/uL   Neutrophils Relative % 84 %   Neutro Abs 17.7 (H) 1.7 - 7.7 K/uL   Lymphocytes Relative 12 %   Lymphs Abs 2.5  0.7 - 4.0 K/uL   Monocytes Relative 3 %   Monocytes Absolute 0.7 0.1 - 1.0 K/uL   Eosinophils Relative 1 %   Eosinophils Absolute 0.2 0.0 - 0.7 K/uL   Basophils Relative 0 %   Basophils Absolute 0.1 0.0 - 0.1 K/uL  TSH     Status: Abnormal   Collection Time: 06/13/15  7:11 PM  Result Value Ref Range   TSH 6.086 (H) 0.350 - 4.500 uIU/mL  Comprehensive metabolic panel     Status: Abnormal   Collection Time: 06/13/15  7:11 PM  Result Value Ref Range   Sodium 138 135 - 145 mmol/L   Potassium 4.1 3.5 - 5.1 mmol/L   Chloride 104 101 - 111 mmol/L   CO2 23 22 - 32 mmol/L   Glucose, Bld 117 (H) 65 - 99 mg/dL   BUN 20 6 - 20 mg/dL   Creatinine, Ser 0.860.72 0.44 - 1.00 mg/dL   Calcium 8.9 8.9 - 57.810.3 mg/dL   Total Protein 5.9 (L) 6.5 - 8.1 g/dL   Albumin 2.6 (L) 3.5 - 5.0 g/dL   AST 469171 (H) 15 - 41 U/L   ALT 161 (H) 14 - 54 U/L   Alkaline Phosphatase 127 (H) 38 - 126 U/L   Total Bilirubin 0.5 0.3 - 1.2 mg/dL   GFR calc non Af Amer >60 >60 mL/min   GFR calc Af Amer >60 >60 mL/min   Anion gap 11 5 - 15  T4, free     Status: None   Collection Time: 06/13/15  7:11 PM  Result Value Ref Range   Free T4 0.73 0.61 - 1.12 ng/dL  Urinalysis, Routine w reflex microscopic (not at [facility])     Status: Abnormal   Collection Time: 06/13/15  9:20 PM  Result Value Ref Range   Color, Urine YELLOW YELLOW   APPearance CLEAR CLEAR   Specific Gravity, Urine 1.020 1.005 - 1.030   pH 8.0 5.0 - 8.0   Glucose, UA NEGATIVE NEGATIVE mg/dL   Hgb urine dipstick TRACE (A) NEGATIVE   Bilirubin Urine NEGATIVE NEGATIVE   Ketones, ur NEGATIVE NEGATIVE mg/dL   Protein, ur NEGATIVE NEGATIVE mg/dL   Nitrite NEGATIVE NEGATIVE   Leukocytes, UA NEGATIVE NEGATIVE  Urine microscopic-add on     Status: Abnormal   Collection Time: 06/13/15  9:20 PM  Result Value Ref Range   Squamous Epithelial / LPF NONE SEEN NONE SEEN   WBC, UA NONE SEEN 0 - 5 WBC/hpf   RBC / HPF NONE SEEN 0 - 5 RBC/hpf   Bacteria, UA MANY (A) NONE SEEN      Assessment/Plan 35yo G2P1011 s/p primary C-section complicated by severe preeclampsia and HELLP syndrome, POD#4 1) HELLP/Severe preeclampsia -BP remains stable in high/normal range, started on Procardia XL 30mg  daily, will continue close outpatient monitoring -Currently asymptomatic -labs as above- thrombocytopenia improving LFTs remain elevated.  Plan for repeat labs in one week as outpatient  2) Tachycardia -no evidence of infection or acute process -EKG with sinus tachycardia -now resolved and pt asymptomatic  3) Postoperative care -pain well controlled -GU: voiding freely.  Lochia appropriate -GI: tolerating gen diet and has had BM  Meeting postoperative milestones appropriately, plan for discharge home with close outpatient monitoring with Wynema Birch, DO [phone] (pager) [phone] (office)

## 2015-06-14 NOTE — Lactation Note (Signed)
This note was copied from the chart of Boy Argentinaosa Garcia-Mosqueda. Lactation Consultation Note  Patient Name: Boy Doneen PoissonRosa Garcia-Mosqueda JXBJY'NToday's Date: 06/14/2015 Reason for consult: Follow-up assessment  Baby 94 hours old. In-house Spanish interpreter "Wallene HuhMarta" assisted. Mom reports that she is seeing colostrum at breast, and hearing a few swallows at breast when baby nurses. Enc mom to nurse first, and then supplement with EBM/formula as needed. Enc mom to hand express just prior to latching baby. Mom states that baby is not hungry now because she gave a bottle. Assisted mom to hand express with colostrum easily flowing. Enc mom to use EBM for slight nipple soreness. Mom states that she is only experiencing some slight nipple discomfort at beginning of BF. Discussed stretching of nipple, and normal progression of milk coming to volume. Mom states that she has a DEBP at home. Enc mom to use pump for additional breast stimulation if baby not willing to nurse well at breast.   Mom aware of OP/BFSG and LC phone line assistance after D/C. Referred mom to Baby and Me booklet for number of diapers to expect by day of life and EBM storage guidelines.  Maternal Data    Feeding    LATCH Score/Interventions                      Lactation Tools Discussed/Used     Consult Status Consult Status: PRN    Geralynn OchsWILLIARD, Tobie Perdue 06/14/2015, 10:59 AM

## 2015-06-14 NOTE — Progress Notes (Addendum)
Results for orders placed or performed during the hospital encounter of 06/09/15 (from the past 24 hour(s))  CBC with Differential/Platelet     Status: Abnormal   Collection Time: 06/13/15  5:35 AM  Result Value Ref Range   WBC 18.3 (H) 4.0 - 10.5 K/uL   RBC 2.51 (L) 3.87 - 5.11 MIL/uL   Hemoglobin 8.1 (L) 12.0 - 15.0 g/dL   HCT 16.1 (L) 09.6 - 04.5 %   MCV 96.0 78.0 - 100.0 fL   MCH 32.3 26.0 - 34.0 pg   MCHC 33.6 30.0 - 36.0 g/dL   RDW 40.9 81.1 - 91.4 %   Platelets 132 (L) 150 - 400 K/uL   Neutrophils Relative % 82 %   Neutro Abs 15.0 (H) 1.7 - 7.7 K/uL   Lymphocytes Relative 13 %   Lymphs Abs 2.4 0.7 - 4.0 K/uL   Monocytes Relative 4 %   Monocytes Absolute 0.8 0.1 - 1.0 K/uL   Eosinophils Relative 1 %   Eosinophils Absolute 0.1 0.0 - 0.7 K/uL   Basophils Relative 0 %   Basophils Absolute 0.0 0.0 - 0.1 K/uL  Comprehensive metabolic panel     Status: Abnormal   Collection Time: 06/13/15  5:35 AM  Result Value Ref Range   Sodium 139 135 - 145 mmol/L   Potassium 4.5 3.5 - 5.1 mmol/L   Chloride 106 101 - 111 mmol/L   CO2 26 22 - 32 mmol/L   Glucose, Bld 81 65 - 99 mg/dL   BUN 20 6 - 20 mg/dL   Creatinine, Ser 7.82 0.44 - 1.00 mg/dL   Calcium 8.2 (L) 8.9 - 10.3 mg/dL   Total Protein 5.1 (L) 6.5 - 8.1 g/dL   Albumin 2.3 (L) 3.5 - 5.0 g/dL   AST 956 (H) 15 - 41 U/L   ALT 133 (H) 14 - 54 U/L   Alkaline Phosphatase 106 38 - 126 U/L   Total Bilirubin 0.4 0.3 - 1.2 mg/dL   GFR calc non Af Amer >60 >60 mL/min   GFR calc Af Amer >60 >60 mL/min   Anion gap 7 5 - 15  Lactate dehydrogenase     Status: Abnormal   Collection Time: 06/13/15  5:35 AM  Result Value Ref Range   LDH 342 (H) 98 - 192 U/L  Uric acid     Status: Abnormal   Collection Time: 06/13/15  5:35 AM  Result Value Ref Range   Uric Acid, Serum 6.7 (H) 2.3 - 6.6 mg/dL  CBC with Differential/Platelet     Status: Abnormal   Collection Time: 06/13/15  7:11 PM  Result Value Ref Range   WBC 21.2 (H) 4.0 - 10.5 K/uL   RBC 2.54 (L) 3.87 - 5.11 MIL/uL   Hemoglobin 8.1 (L) 12.0 - 15.0 g/dL   HCT 21.3 (L) 08.6 - 57.8 %   MCV 96.5 78.0 - 100.0 fL   MCH 31.9 26.0 - 34.0 pg   MCHC 33.1 30.0 - 36.0 g/dL   RDW 46.9 62.9 - 52.8 %   Platelets 185 150 - 400 K/uL   Neutrophils Relative % 84 %   Neutro Abs 17.7 (H) 1.7 - 7.7 K/uL   Lymphocytes Relative 12 %   Lymphs Abs 2.5 0.7 - 4.0 K/uL   Monocytes Relative 3 %   Monocytes Absolute 0.7 0.1 - 1.0 K/uL   Eosinophils Relative 1 %   Eosinophils Absolute 0.2 0.0 - 0.7 K/uL   Basophils Relative 0 %   Basophils  Absolute 0.1 0.0 - 0.1 K/uL  TSH     Status: Abnormal   Collection Time: 06/13/15  7:11 PM  Result Value Ref Range   TSH 6.086 (H) 0.350 - 4.500 uIU/mL  Comprehensive metabolic panel     Status: Abnormal   Collection Time: 06/13/15  7:11 PM  Result Value Ref Range   Sodium 138 135 - 145 mmol/L   Potassium 4.1 3.5 - 5.1 mmol/L   Chloride 104 101 - 111 mmol/L   CO2 23 22 - 32 mmol/L   Glucose, Bld 117 (H) 65 - 99 mg/dL   BUN 20 6 - 20 mg/dL   Creatinine, Ser 1.190.72 0.44 - 1.00 mg/dL   Calcium 8.9 8.9 - 14.710.3 mg/dL   Total Protein 5.9 (L) 6.5 - 8.1 g/dL   Albumin 2.6 (L) 3.5 - 5.0 g/dL   AST 829171 (H) 15 - 41 U/L   ALT 161 (H) 14 - 54 U/L   Alkaline Phosphatase 127 (H) 38 - 126 U/L   Total Bilirubin 0.5 0.3 - 1.2 mg/dL   GFR calc non Af Amer >60 >60 mL/min   GFR calc Af Amer >60 >60 mL/min   Anion gap 11 5 - 15  T4, free     Status: None   Collection Time: 06/13/15  7:11 PM  Result Value Ref Range   Free T4 0.73 0.61 - 1.12 ng/dL  Urinalysis, Routine w reflex microscopic ([facility])     Status: Abnormal   Collection Time: 06/13/15  9:20 PM  Result Value Ref Range   Color, Urine YELLOW YELLOW   APPearance CLEAR CLEAR   Specific Gravity, Urine 1.020 1.005 - 1.030   pH 8.0 5.0 - 8.0   Glucose, UA NEGATIVE NEGATIVE mg/dL   Hgb urine dipstick TRACE (A) NEGATIVE   Bilirubin Urine NEGATIVE NEGATIVE   Ketones, ur NEGATIVE NEGATIVE mg/dL   Protein,  ur NEGATIVE NEGATIVE mg/dL   Nitrite NEGATIVE NEGATIVE   Leukocytes, UA NEGATIVE NEGATIVE  Urine microscopic-add on     Status: Abnormal   Collection Time: 06/13/15  9:20 PM  Result Value Ref Range   Squamous Epithelial / LPF NONE SEEN NONE SEEN   WBC, UA NONE SEEN 0 - 5 WBC/hpf   RBC / HPF NONE SEEN 0 - 5 RBC/hpf   Bacteria, UA MANY (A) NONE SEEN   Today's Vitals   06/14/15 0107 06/14/15 0108 06/14/15 0207 06/14/15 0322  BP: 156/83     Pulse: 103 95    Temp:      TempSrc:      Resp: 16     Height:      Weight:      SpO2: 99%     PainSc:   Asleep Asleep   BP range = 131-156/83-87  Pulse  2123: 113 bpm per machine, 108 bpm manually 2324: 139 bpm per machine, 122 bpm manually 2325: 134 bpm per machine 0107: 103 bpm per machine 0108: 95 bpm per machine  O2 sats: 98-100% on RA  A: Subclinical Hypothyroidism UA essentially neg Asymptomatic  P: CTO. Vitals every 4 hours.   Sherre ScarletKimberly Jaydien [place]epinto, CNM 06/14/15, 4:23 AM

## 2015-06-14 NOTE — Progress Notes (Addendum)
Use of translator required due to Spanish speaking only. FOB not present.  S: Denies h/a, visual changes, epigastric pain, CP, palpitations, weakness, N/V or difficulty breathing. Tolerating food/fluids & voiding w/o difficulty. +Flatus and BM.  O: BP 147/79, T-98, P-122, R-18, SP02-100%.  Max temp 98.5; BP range 130-171/70-85; pulse range 84-129 bpm (manual pulse taken at 2028 and noted to be 91 bpm - machine displayed pulse rate as 122 bpm).  WBC: 21.2 at 1911, up from 18.3 at 0535.  Platelets: 185 at 1911, up from 132 at 0535.  I/O: 240 ml in, 600 ml out at 2030.  Gen: NAD, color pink, ambulating & breastfeeding w/o issues. Lungs: CTAB. CV: Rapid HB, no M/R/G. Abdomen: soft, NT/ND, fundus firm at U-2. Honeycomb dressing in place & C/D/I. Active BS x 4. Lochia: Scant Ext: No evidence of DVT seen on PE. 2 beat clonus on left, none on right. Trace-1+ calf/ankle edema, 1+ DTRs bilaterally ECG completed and [facility] -- results = Sinus Tachycardia.  A: 35 yo, G2nowP1011, s/p c-section d/t preE w/ severe features/HELLP syndrome, POD #3, now w/ elevated BPs and elevated HR. Tachycardia of unknown etiology.  Leukocytosis. Anemia. Normal involution. Improved platelets. UOP quantity sufficient  P: Discussed w/ Dr. Charlotta Newtonzan.  CTO closely. Continue daily Procardia.  Await results of TSH. Cath UA/urine culture now. Vitals q 2hrs for now.  Sherre ScarletKimberly Lila Lufkin, CNM 06/13/15, 9 PM

## 2015-06-14 NOTE — Discharge Summary (Signed)
OB Discharge Summary     Patient Name: Kristina Gordon DOB: 06/29/1980 MRN: [id number]  Date of admission: 06/09/2015 Delivering MD: Jaymes GraffILLARD, NAIMA   Date of discharge: 06/14/2015  Admitting diagnosis: 39w water broke, ctx every 30 min Intrauterine pregnancy: 6385w1d     Secondary diagnosis:  Principal Problem:   S/P cesarean section Active Problems:   Language barrier   AMA (advanced maternal age) multigravida 35+   Abnormal findings on antenatal screening of mother -- DS risk 1/53; MFM consult; growth scan monthly; pt declined further testing   SS (short stature)   Normal labor   HELLP syndrome   Severe preeclampsia  Additional problems: none     Discharge diagnosis: Term Pregnancy Delivered, Preeclampsia (severe) and HELLP                                                                                                Post partum procedures:magnesium sulfate   Augmentation: Pitocin  Complications: None  Hospital course:  Onset of Labor With Unplanned C/S  [age] y.o. yo G2P1011 at 8885w1d was admitted in Latent Labor on 06/09/2015. Patient had a labor course significant for Spontaneous Membrane Rupture Time/Date: 6:00 PM ,06/09/2015   The patient went for cesarean section due to PreEclampsia with severe features and HELLP syndrome, and delivered a Viable infant,06/10/2015  Details of operation can be found in separate operative note. Patient's postpartum course was complicated by persistent tachycardia noted on POD#3.  Work up was performed and found to be negative.  With routine postpartum care, the tachycardia resolved and by POD#4 the patient was clinically improving.  Additionally, due to elevated blood pressures, the patient was started on oral Procardia.  She was meeting postoperative milestones appropriately including ambulating,tolerating a regular diet, passing flatus, and urinating well.  Patient is discharged home in stable condition 06/14/2015.  Physical exam  Filed Vitals:    06/14/15 0107 06/14/15 0108 06/14/15 0517 06/14/15 0817  BP: 156/83  143/79 150/83  Pulse: 103 95 100 97  Temp:   98.1 F (36.7 C) 97.9 F (36.6 C)  TempSrc:      Resp: 16  16 17   Height:      Weight:      SpO2: 99%  100% 100%   General: alert and cooperative Lochia: appropriate Uterine Fundus: firm Incision: Healing well with no significant drainage DVT Evaluation: No evidence of DVT seen on physical exam. Negative Homan's sign. Mild ankle/pedal edema, non pitting.  Labs: Lab Results  Component Value Date   WBC 21.2* 06/13/2015   HGB 8.1* 06/13/2015   HCT 24.5* 06/13/2015   MCV 96.5 06/13/2015   PLT 185 06/13/2015   CMP Latest Ref Rng 06/13/2015  Glucose 65 - 99 mg/dL 132(G117(H)  BUN 6 - 20 mg/dL 20  Creatinine 4.010.44 - 0.271.00 mg/dL 2.530.72  Sodium 664135 - 403145 mmol/L 138  Potassium 3.5 - 5.1 mmol/L 4.1  Chloride 101 - 111 mmol/L 104  CO2 22 - 32 mmol/L 23  Calcium 8.9 - 10.3 mg/dL 8.9  Total Protein 6.5 - 8.1 g/dL 5.9(L)  Total Bilirubin 0.3 - 1.2  mg/dL 0.5  Alkaline Phos 38 - 126 U/L 127(H)  AST 15 - 41 U/L 171(H)  ALT 14 - 54 U/L 161(H)    Discharge instruction: per After Visit Summary and "Baby and Me Booklet".  After visit meds:    Medication List    STOP taking these medications        acetaminophen 500 MG tablet  Commonly known as:  TYLENOL     calcium carbonate 500 MG chewable tablet  Commonly known as:  TUMS - dosed in mg elemental calcium      TAKE these medications        ferrous sulfate 325 (65 FE) MG tablet  Take 1 tablet (325 mg total) by mouth 2 (two) times daily with a meal. For 14 days, then once daily for 28 days.     ibuprofen 600 MG tablet  Commonly known as:  ADVIL,MOTRIN  Take 1 tablet (600 mg total) by mouth every 6 (six) hours as needed.     NIFEdipine 30 MG 24 hr tablet  Commonly known as:  PROCARDIA-XL/ADALAT CC  Take 1 tablet (30 mg total) by mouth daily.     oxyCODONE-acetaminophen 5-325 MG tablet  Commonly known as:   PERCOCET/ROXICET  Take 1-2 tablets by mouth every 4 (four) hours as needed (pain scale > 7).     PRENATAL/IRON Tabs  Take 1 tablet by mouth daily.        Diet: routine diet  Activity: Advance as tolerated. Pelvic rest for 6 weeks.   Outpatient follow XB:JYNWG start RN to visit on Wednesday 1/18 for BP .   Go to [facility] on Friday 06/20/15 for BP check and schedule postpartum appointment in 6 weeks.  Follow up Appt:No future appointments. Follow up Visit:No Follow-up on file.  Postpartum contraception: Condoms   Newborn Data: Live born female  Birth Weight: 5 lb 10.3 oz (2560 g) APGAR: 8, 9  Baby Feeding: Breast Disposition:home with mother   06/14/2015     [address], [place]

## 2015-06-15 LAB — URINE CULTURE: CULTURE: NO GROWTH

## 2019-06-01 NOTE — L&D Delivery Note (Signed)
OB/GYN Faculty Practice Delivery Note  Kristina Gordon is a 39 y.o. G3P1011 s/p VBAC at [redacted]w[redacted]d. She was admitted for IOL for gHTN.   ROM: 7h 64m with clear fluid GBS Status:  Negative/-- (10/07 1331) Maximum Maternal Temperature: n/a  Labor Progress: . Initial SVE: closed/thick/ballotable on evening of 10/12. She received IV Pitocin and a Foley Balloon. She made slow change throughout the day on 10/14, had AROM and IUPC placed at 1800. She then progressed to complete.   Delivery Date/Time: 10/14 at 0147 Delivery: Called to room and patient was complete and pushing. Head delivered ROA. No nuchal cord present. Shoulder and body delivered in usual fashion. Infant with spontaneous cry, placed on mother's abdomen, dried and stimulated. Cord clamped x 2 after 1-minute delay, and cut by FOB. Cord blood drawn. Placenta delivered spontaneously with gentle cord traction. Brisk bleeding and several clots expressed immediately after delivery, 1g TXA given. Fundus firm with massage and Pitocin. Labia, perineum, vagina, and cervix inspected inspected with second degree perineal laceration, repaired in routine fashion.   Baby Weight: pending  Placenta: Sent to L&D Complications: None Lacerations: second degree, repaired EBL: 535 mL Analgesia: Epidural   Infant:  APGAR (1 MIN): 8   APGAR (5 MINS): 9   APGAR (10 MINS):     Casper Harrison, MD [facility] Family Medicine Fellow, [facility] for [facility], [facility] 03/13/2020, 2:27 AM

## 2019-09-17 LAB — PREGNANCY, URINE: Preg Test, Ur: POSITIVE

## 2019-12-10 ENCOUNTER — Encounter: Payer: Self-pay | Admitting: General Practice

## 2019-12-12 ENCOUNTER — Ambulatory Visit (INDEPENDENT_AMBULATORY_CARE_PROVIDER_SITE_OTHER): Payer: 59 | Admitting: *Deleted

## 2019-12-12 ENCOUNTER — Other Ambulatory Visit: Payer: Self-pay

## 2019-12-12 VITALS — BP 117/74 | HR 77 | Ht <= 58 in | Wt 115.5 lb

## 2019-12-12 DIAGNOSIS — O09299 Supervision of pregnancy with other poor reproductive or obstetric history, unspecified trimester: Secondary | ICD-10-CM | POA: Insufficient documentation

## 2019-12-12 DIAGNOSIS — O0992 Supervision of high risk pregnancy, unspecified, second trimester: Secondary | ICD-10-CM | POA: Diagnosis not present

## 2019-12-12 DIAGNOSIS — Z3A25 25 weeks gestation of pregnancy: Secondary | ICD-10-CM

## 2019-12-12 DIAGNOSIS — Z789 Other specified health status: Secondary | ICD-10-CM

## 2019-12-12 DIAGNOSIS — Z8759 Personal history of other complications of pregnancy, childbirth and the puerperium: Secondary | ICD-10-CM

## 2019-12-12 DIAGNOSIS — O09529 Supervision of elderly multigravida, unspecified trimester: Secondary | ICD-10-CM

## 2019-12-12 DIAGNOSIS — O0990 Supervision of high risk pregnancy, unspecified, unspecified trimester: Secondary | ICD-10-CM | POA: Insufficient documentation

## 2019-12-12 LAB — POCT URINALYSIS DIP (DEVICE)
Bilirubin Urine: NEGATIVE
Glucose, UA: NEGATIVE mg/dL
Hgb urine dipstick: NEGATIVE
Ketones, ur: 15 mg/dL — AB
Leukocytes,Ua: NEGATIVE
Nitrite: NEGATIVE
Protein, ur: NEGATIVE mg/dL
Specific Gravity, Urine: 1.025 (ref 1.005–1.030)
Urobilinogen, UA: 0.2 mg/dL (ref 0.0–1.0)
pH: 7 (ref 5.0–8.0)

## 2019-12-12 NOTE — Patient Instructions (Signed)

## 2019-12-12 NOTE — Progress Notes (Signed)
Here for New OB Intake in person today. She confirmed she had pregnancy confirmed at [facility] and LMP 06/23/19. We started with one interpreter on ipad and then she had to leave and we used a second interpreter on ipad.  I explained I am completing her New OB Intake today. We discussed Her EDD and that it is based on  sure LMP . I reviewed her allergies, meds, OB History, Medical /Surgical history, and appropriate screenings. I informed her of [facility] services. She is G3P1011 ; high risk with hx HELLP with last pregnancy.   I explained we will ask her to take her blood pressure weekly during her pregnancy. We discussed her Insurance [employer] does not cover a RX for blood pressure cuff. I asked if she can purchase one. She states she thinks she can buy one.   I asked her to bring the blood pressure cuff with her to her first ob appointment so we can show her how to use it. Explained  then we will have her take her blood pressure weekly and write it down and show [place] at each visit.  I explained she will have some visits in office and some virtually.   I reviewed her new ob  appointment date/ time with her , our location and to wear mask,  I explained she will have a pelvic exam pap, and culture. Today we drew ob bloodwork, hemoglobin a1C,  and  genetic testing. Per discussion with Dr. Vergie Living due to her history of preeclampsia we also did a CMET and urine protein/ creatinine.  We also obtained a clean catch UA for UA, urine culture.   I informed her we will schedule her for an [place] at 19 weeks and she will get the the appointment at her new ob appointment. She voices understanding.  Davion Flannery,RN

## 2019-12-13 ENCOUNTER — Ambulatory Visit (INDEPENDENT_AMBULATORY_CARE_PROVIDER_SITE_OTHER): Payer: 59 | Admitting: Obstetrics and Gynecology

## 2019-12-13 ENCOUNTER — Other Ambulatory Visit (HOSPITAL_COMMUNITY)
Admission: RE | Admit: 2019-12-13 | Discharge: 2019-12-13 | Disposition: A | Discharge status: Home / Self Care | Payer: 59 | Source: Ambulatory Visit | Attending: Obstetrics and Gynecology | Admitting: Obstetrics and Gynecology

## 2019-12-13 ENCOUNTER — Encounter: Payer: Self-pay | Admitting: Obstetrics and Gynecology

## 2019-12-13 VITALS — BP 107/68 | HR 73

## 2019-12-13 DIAGNOSIS — Z789 Other specified health status: Secondary | ICD-10-CM

## 2019-12-13 DIAGNOSIS — O0990 Supervision of high risk pregnancy, unspecified, unspecified trimester: Secondary | ICD-10-CM | POA: Insufficient documentation

## 2019-12-13 DIAGNOSIS — O0992 Supervision of high risk pregnancy, unspecified, second trimester: Secondary | ICD-10-CM

## 2019-12-13 DIAGNOSIS — O09522 Supervision of elderly multigravida, second trimester: Secondary | ICD-10-CM

## 2019-12-13 DIAGNOSIS — O09292 Supervision of pregnancy with other poor reproductive or obstetric history, second trimester: Secondary | ICD-10-CM

## 2019-12-13 DIAGNOSIS — O09299 Supervision of pregnancy with other poor reproductive or obstetric history, unspecified trimester: Secondary | ICD-10-CM

## 2019-12-13 DIAGNOSIS — Z98891 History of uterine scar from previous surgery: Secondary | ICD-10-CM

## 2019-12-13 DIAGNOSIS — Z3A24 24 weeks gestation of pregnancy: Secondary | ICD-10-CM

## 2019-12-13 DIAGNOSIS — O34219 Maternal care for unspecified type scar from previous cesarean delivery: Secondary | ICD-10-CM

## 2019-12-13 LAB — COMPREHENSIVE METABOLIC PANEL
ALT: 10 IU/L (ref 0–32)
AST: 17 IU/L (ref 0–40)
Albumin/Globulin Ratio: 1.4 (ref 1.2–2.2)
Albumin: 3.9 g/dL (ref 3.8–4.8)
Alkaline Phosphatase: 51 IU/L (ref 48–121)
BUN/Creatinine Ratio: 12 (ref 9–23)
BUN: 9 mg/dL (ref 6–20)
Bilirubin Total: <0.2 mg/dL (ref 0.0–1.2)
CO2: 18 mmol/L — ABNORMAL LOW (ref 20–29)
Calcium: 9.2 mg/dL (ref 8.7–10.2)
Chloride: 104 mmol/L (ref 96–106)
Creatinine, Ser: 0.74 mg/dL (ref 0.57–1.00)
GFR calc Af Amer: 118 mL/min/{1.73_m2} (ref >59)
GFR calc non Af Amer: 102 mL/min/{1.73_m2} (ref >59)
Globulin, Total: 2.8 g/dL (ref 1.5–4.5)
Glucose: 83 mg/dL (ref 65–99)
Potassium: 4 mmol/L (ref 3.5–5.2)
Sodium: 135 mmol/L (ref 134–144)
Total Protein: 6.7 g/dL (ref 6.0–8.5)

## 2019-12-13 LAB — CBC/D/PLT+RPR+RH+ABO+RUB AB...
Antibody Screen: NEGATIVE
Basophils Absolute: 0 10*3/uL (ref 0.0–0.2)
Basos: 0 %
EOS (ABSOLUTE): 0 10*3/uL (ref 0.0–0.4)
Eos: 0 %
HCV Ab: <0.1 s/co ratio (ref 0.0–0.9)
HIV Screen 4th Generation wRfx: NONREACTIVE
Hematocrit: 33.4 % — ABNORMAL LOW (ref 34.0–46.6)
Hemoglobin: 11.3 g/dL (ref 11.1–15.9)
Hepatitis B Surface Ag: NEGATIVE
Immature Grans (Abs): 0 10*3/uL (ref 0.0–0.1)
Immature Granulocytes: 0 %
Lymphocytes Absolute: 1.1 10*3/uL (ref 0.7–3.1)
Lymphs: 14 %
MCH: 31.9 pg (ref 26.6–33.0)
MCHC: 33.8 g/dL (ref 31.5–35.7)
MCV: 94 fL (ref 79–97)
Monocytes Absolute: 0.6 10*3/uL (ref 0.1–0.9)
Monocytes: 8 %
Neutrophils Absolute: 5.6 10*3/uL (ref 1.4–7.0)
Neutrophils: 78 %
Platelets: 236 10*3/uL (ref 150–450)
RBC: 3.54 x10E6/uL — ABNORMAL LOW (ref 3.77–5.28)
RDW: 12.9 % (ref 11.7–15.4)
RPR Ser Ql: NONREACTIVE
Rh Factor: POSITIVE
Rubella Antibodies, IGG: 11.3 index (ref >0.99)
WBC: 7.3 10*3/uL (ref 3.4–10.8)

## 2019-12-13 LAB — HEMOGLOBIN A1C
Est. average glucose Bld gHb Est-mCnc: 103 mg/dL
Hgb A1c MFr Bld: 5.2 % (ref 4.8–5.6)

## 2019-12-13 LAB — PROTEIN / CREATININE RATIO, URINE
Creatinine, Urine: 119.9 mg/dL
Protein, Ur: 5.7 mg/dL
Protein/Creat Ratio: 48 mg/g creat (ref 0–200)

## 2019-12-13 LAB — HCV INTERPRETATION

## 2019-12-13 NOTE — Progress Notes (Signed)
Subjective:    Kristina Gordon is a G3P1011 [redacted]w[redacted]d being seen today for her first obstetrical visit.  Her obstetrical history is significant for advanced maternal age, pre-eclampsia and previous cesarean section. Patient does intend to breast feed. Pregnancy history fully reviewed.  Patient reports no complaints.  Vitals:   12/13/19 1011  BP: 107/68  Pulse: 73    HISTORY: OB History  Gravida Para Term Preterm AB Living  3 1 1   1 1   SAB TAB Ectopic Multiple Live Births  0 0 1 0 1    # Outcome Date GA Lbr Len/2nd Weight Sex Delivery Anes PTL Lv  3 Current           2 Term 06/10/15 [redacted]w[redacted]d  5 lb 10.3 oz (2.56 kg) M CS-LTranv Spinal, EPI  LIV     Birth Comments: wnl and c/w GA; Pre-eclampsia and HELLP  1 Ectopic 2015           Past Medical History:  Diagnosis Date  . Anemia   . Complication of anesthesia    pt. states it took her " time " to wake up   . HELLP (hemolytic anemia/elev liver enzymes/low platelets in pregnancy)   . Infection    UTI  . Miscarriage   . Ovarian cyst   . Pregnancy induced hypertension    Past Surgical History:  Procedure Laterality Date  . CESAREAN SECTION N/A 06/10/2015   Procedure: CESAREAN SECTION;  Surgeon: 08/08/2015, MD;  Location: [facility];  Service: Obstetrics;  Laterality: N/A;  . NO PAST SURGERIES     Family History  Problem Relation Age of Onset  . Hypertension Maternal Grandfather   . Cancer Maternal Grandfather        leukemia  . Varicose Veins Mother   . Hyperlipidemia Father      Exam    Uterus:     Pelvic Exam:    Perineum: Normal Perineum   Vulva: normal   Vagina:  normal mucosa, normal discharge   pH:    Cervix: multiparous appearance and cervix is closed and long   Adnexa: not evaluated   Bony Pelvis: gynecoid  System: Breast:  normal appearance, no masses or tenderness   Skin: normal coloration and turgor, no rashes    Neurologic: oriented, no focal deficits   Extremities: normal strength, tone, and  muscle mass   HEENT extra ocular movement intact   Mouth/Teeth mucous membranes moist, pharynx normal without lesions and dental hygiene good   Neck supple and no masses   Cardiovascular: regular rate and rhythm   Respiratory:  appears well, vitals normal, no respiratory distress, acyanotic, normal RR, chest clear, no wheezing, crepitations, rhonchi, normal symmetric air entry   Abdomen: soft, non-tender; bowel sounds normal; no masses,  no organomegaly   Urinary:       Assessment:    Pregnancy: G3P1011 Patient Active Problem List   Diagnosis Date Noted  . Supervision of high risk pregnancy, antepartum 12/12/2019  . History of HELLP syndrome, currently pregnant 12/12/2019  . S/P cesarean section 06/13/2015  . Normal labor 06/10/2015  . Language barrier 03/10/2015  . Syncope 03/10/2015  . AMA (advanced maternal age) multigravida 35+ 03/10/2015  . Abnormal findings on antenatal screening of mother -- DS risk 1/53; MFM consult; growth scan monthly; pt declined further testing 03/10/2015  . SS (short stature) 03/10/2015        Plan:     Initial labs drawn. Prenatal vitamins. Problem list reviewed and updated.  Genetic Screening discussed : ordered.  Ultrasound discussed; fetal survey: ordered. Patient is interested in [facility]. Previous c-section for worsening pre-eclampsia at term. TOLAC information provided  Follow up in 4 weeks. 50% of 30 min visit spent on counseling and coordination of care.     Vernal Rutan 12/13/2019

## 2019-12-13 NOTE — Patient Instructions (Addendum)
 Second Trimester of Pregnancy The second trimester is from week 14 through week 27 (months 4 through 6). The second trimester is often a time when you feel your best. Your body has adjusted to being pregnant, and you begin to feel better physically. Usually, morning sickness has lessened or quit completely, you may have more energy, and you may have an increase in appetite. The second trimester is also a time when the fetus is growing rapidly. At the end of the sixth month, the fetus is about 9 inches long and weighs about 1 pounds. You will likely begin to feel the baby move (quickening) between 16 and 20 weeks of pregnancy. Body changes during your second trimester Your body continues to go through many changes during your second trimester. The changes vary from woman to woman.  Your weight will continue to increase. You will notice your lower abdomen bulging out.  You may begin to get stretch marks on your hips, abdomen, and breasts.  You may develop headaches that can be relieved by medicines. The medicines should be approved by your health care provider.  You may urinate more often because the fetus is pressing on your bladder.  You may develop or continue to have heartburn as a result of your pregnancy.  You may develop constipation because certain hormones are causing the muscles that push waste through your intestines to slow down.  You may develop hemorrhoids or swollen, bulging veins (varicose veins).  You may have back pain. This is caused by: ? Weight gain. ? Pregnancy hormones that are relaxing the joints in your pelvis. ? A shift in weight and the muscles that support your balance.  Your breasts will continue to grow and they will continue to become tender.  Your gums may bleed and may be sensitive to brushing and flossing.  Dark spots or blotches (chloasma, mask of pregnancy) may develop on your face. This will likely fade after the baby is born.  A dark line from  your belly button to the pubic area (linea nigra) may appear. This will likely fade after the baby is born.  You may have changes in your hair. These can include thickening of your hair, rapid growth, and changes in texture. Some women also have hair loss during or after pregnancy, or hair that feels dry or thin. Your hair will most likely return to normal after your baby is born. What to expect at prenatal visits During a routine prenatal visit:  You will be weighed to make sure you and the fetus are growing normally.  Your blood pressure will be taken.  Your abdomen will be measured to track your baby's growth.  The fetal heartbeat will be listened to.  Any test results from the previous visit will be discussed. Your health care provider may ask you:  How you are feeling.  If you are feeling the baby move.  If you have had any abnormal symptoms, such as leaking fluid, bleeding, severe headaches, or abdominal cramping.  If you are using any tobacco products, including cigarettes, chewing tobacco, and electronic cigarettes.  If you have any questions. Other tests that may be performed during your second trimester include:  Blood tests that check for: ? Low iron levels (anemia). ? High blood sugar that affects pregnant women (gestational diabetes) between 24 and 28 weeks. ? Rh antibodies. This is to check for a protein on red blood cells (Rh factor).  Urine tests to check for infections, diabetes, or protein in   the urine.  An ultrasound to confirm the proper growth and development of the baby.  An amniocentesis to check for possible genetic problems.  Fetal screens for spina bifida and Down syndrome.  HIV (human immunodeficiency virus) testing. Routine prenatal testing includes screening for HIV, unless you choose not to have this test. Follow these instructions at home: Medicines  Follow your health care provider's instructions regarding medicine use. Specific medicines  may be either safe or unsafe to take during pregnancy.  Take a prenatal vitamin that contains at least 600 micrograms (mcg) of folic acid.  If you develop constipation, try taking a stool softener if your health care provider approves. Eating and drinking   Eat a balanced diet that includes fresh fruits and vegetables, whole grains, good sources of protein such as meat, eggs, or tofu, and low-fat dairy. Your health care provider will help you determine the amount of weight gain that is right for you.  Avoid raw meat and uncooked cheese. These carry germs that can cause birth defects in the baby.  If you have low calcium intake from food, talk to your health care provider about whether you should take a daily calcium supplement.  Limit foods that are high in fat and processed sugars, such as fried and sweet foods.  To prevent constipation: ? Drink enough fluid to keep your urine clear or pale yellow. ? Eat foods that are high in fiber, such as fresh fruits and vegetables, whole grains, and beans. Activity  Exercise only as directed by your health care provider. Most women can continue their usual exercise routine during pregnancy. Try to exercise for 30 minutes at least 5 days a week. Stop exercising if you experience uterine contractions.  Avoid heavy lifting, wear low heel shoes, and practice good posture.  A sexual relationship may be continued unless your health care provider directs you otherwise. Relieving pain and discomfort  Wear a good support bra to prevent discomfort from breast tenderness.  Take warm sitz baths to soothe any pain or discomfort caused by hemorrhoids. Use hemorrhoid cream if your health care provider approves.  Rest with your legs elevated if you have leg cramps or low back pain.  If you develop varicose veins, wear support hose. Elevate your feet for 15 minutes, 3-4 times a day. Limit salt in your diet. Prenatal Care  Write down your questions. Take  them to your prenatal visits.  Keep all your prenatal visits as told by your health care provider. This is important. Safety  Wear your seat belt at all times when driving.  Make a list of emergency phone numbers, including numbers for family, friends, the hospital, and police and fire departments. General instructions  Ask your health care provider for a referral to a local prenatal education class. Begin classes no later than the beginning of month 6 of your pregnancy.  Ask for help if you have counseling or nutritional needs during pregnancy. Your health care provider can offer advice or refer you to specialists for help with various needs.  Do not use hot tubs, steam rooms, or saunas.  Do not douche or use tampons or scented sanitary pads.  Do not cross your legs for long periods of time.  Avoid cat litter boxes and soil used by cats. These carry germs that can cause birth defects in the baby and possibly loss of the fetus by miscarriage or stillbirth.  Avoid all smoking, herbs, alcohol, and unprescribed drugs. Chemicals in these products can affect the   formation and growth of the baby.  Do not use any products that contain nicotine or tobacco, such as cigarettes and e-cigarettes. If you need help quitting, ask your health care provider.  Visit your dentist if you have not gone yet during your pregnancy. Use a soft toothbrush to brush your teeth and be gentle when you floss. Contact a health care provider if:  You have dizziness.  You have mild pelvic cramps, pelvic pressure, or nagging pain in the abdominal area.  You have persistent nausea, vomiting, or diarrhea.  You have a bad smelling vaginal discharge.  You have pain when you urinate. Get help right away if:  You have a fever.  You are leaking fluid from your vagina.  You have spotting or bleeding from your vagina.  You have severe abdominal cramping or pain.  You have rapid weight gain or weight loss.  You  have shortness of breath with chest pain.  You notice sudden or extreme swelling of your face, hands, ankles, feet, or legs.  You have not felt your baby move in over an hour.  You have severe headaches that do not go away when you take medicine.  You have vision changes. Summary  The second trimester is from week 14 through week 27 (months 4 through 6). It is also a time when the fetus is growing rapidly.  Your body goes through many changes during pregnancy. The changes vary from woman to woman.  Avoid all smoking, herbs, alcohol, and unprescribed drugs. These chemicals affect the formation and growth your baby.  Do not use any tobacco products, such as cigarettes, chewing tobacco, and e-cigarettes. If you need help quitting, ask your health care provider.  Contact your health care provider if you have any questions. Keep all prenatal visits as told by your health care provider. This is important. This information is not intended to replace advice given to you by your health care provider. Make sure you discuss any questions you have with your health care provider. Document Revised: 09/08/2018 Document Reviewed: 06/22/2016 [employer] Patient Education  2020 [employer]   Contraception Choices Contraception, also called birth control, refers to methods or devices that prevent pregnancy. Hormonal methods Contraceptive implant  A contraceptive implant is a thin, plastic tube that contains a hormone. It is inserted into the upper part of the arm. It can remain in place for up to 3 years. Progestin-only injections Progestin-only injections are injections of progestin, a synthetic form of the hormone progesterone. They are given every 3 months by a health care provider. Birth control pills  Birth control pills are pills that contain hormones that prevent pregnancy. They must be taken once a day, preferably at the same time each day. Birth control patch  The birth control patch  contains hormones that prevent pregnancy. It is placed on the skin and must be changed once a week for three weeks and removed on the fourth week. A prescription is needed to use this method of contraception. Vaginal ring  A vaginal ring contains hormones that prevent pregnancy. It is placed in the vagina for three weeks and removed on the fourth week. After that, the process is repeated with a new ring. A prescription is needed to use this method of contraception. Emergency contraceptive Emergency contraceptives prevent pregnancy after unprotected sex. They come in pill form and can be taken up to 5 days after sex. They work best the sooner they are taken after having sex. Most emergency contraceptives are available   without a prescription. This method should not be used as your only form of birth control. Barrier methods Female condom  A female condom is a thin sheath that is worn over the penis during sex. Condoms keep sperm from going inside a woman's body. They can be used with a spermicide to increase their effectiveness. They should be disposed after a single use. Female condom  A female condom is a soft, loose-fitting sheath that is put into the vagina before sex. The condom keeps sperm from going inside a woman's body. They should be disposed after a single use. Diaphragm  A diaphragm is a soft, dome-shaped barrier. It is inserted into the vagina before sex, along with a spermicide. The diaphragm blocks sperm from entering the uterus, and the spermicide kills sperm. A diaphragm should be left in the vagina for 6-8 hours after sex and removed within 24 hours. A diaphragm is prescribed and fitted by a health care provider. A diaphragm should be replaced every 1-2 years, after giving birth, after gaining more than 15 lb (6.8 kg), and after pelvic surgery. Cervical cap  A cervical cap is a round, soft latex or plastic cup that fits over the cervix. It is inserted into the vagina before sex, along  with spermicide. It blocks sperm from entering the uterus. The cap should be left in place for 6-8 hours after sex and removed within 48 hours. A cervical cap must be prescribed and fitted by a health care provider. It should be replaced every 2 years. Sponge  A sponge is a soft, circular piece of polyurethane foam with spermicide on it. The sponge helps block sperm from entering the uterus, and the spermicide kills sperm. To use it, you make it wet and then insert it into the vagina. It should be inserted before sex, left in for at least 6 hours after sex, and removed and thrown away within 30 hours. Spermicides Spermicides are chemicals that kill or block sperm from entering the cervix and uterus. They can come as a cream, jelly, suppository, foam, or tablet. A spermicide should be inserted into the vagina with an applicator at least 10-15 minutes before sex to allow time for it to work. The process must be repeated every time you have sex. Spermicides do not require a prescription. Intrauterine contraception Intrauterine device (IUD) An IUD is a T-shaped device that is put in a woman's uterus. There are two types:  Hormone IUD.This type contains progestin, a synthetic form of the hormone progesterone. This type can stay in place for 3-5 years.  Copper IUD.This type is wrapped in copper wire. It can stay in place for 10 years.  Permanent methods of contraception Female tubal ligation In this method, a woman's fallopian tubes are sealed, tied, or blocked during surgery to prevent eggs from traveling to the uterus. Hysteroscopic sterilization In this method, a small, flexible insert is placed into each fallopian tube. The inserts cause scar tissue to form in the fallopian tubes and block them, so sperm cannot reach an egg. The procedure takes about 3 months to be effective. Another form of birth control must be used during those 3 months. Female sterilization This is a procedure to tie off the  tubes that carry sperm (vasectomy). After the procedure, the man can still ejaculate fluid (semen). Natural planning methods Natural family planning In this method, a couple does not have sex on days when the woman could become pregnant. Calendar method This means keeping track of the length   of each menstrual cycle, identifying the days when pregnancy can happen, and not having sex on those days. Ovulation method In this method, a couple avoids sex during ovulation. Symptothermal method This method involves not having sex during ovulation. The woman typically checks for ovulation by watching changes in her temperature and in the consistency of cervical mucus. Post-ovulation method In this method, a couple waits to have sex until after ovulation. Summary  Contraception, also called birth control, means methods or devices that prevent pregnancy.  Hormonal methods of contraception include implants, injections, pills, patches, vaginal rings, and emergency contraceptives.  Barrier methods of contraception can include female condoms, female condoms, diaphragms, cervical caps, sponges, and spermicides.  There are two types of IUDs (intrauterine devices). An IUD can be put in a woman's uterus to prevent pregnancy for 3-5 years.  Permanent sterilization can be done through a procedure for males, females, or both.  Natural family planning methods involve not having sex on days when the woman could become pregnant. This information is not intended to replace advice given to you by your health care provider. Make sure you discuss any questions you have with your health care provider. Document Revised: 05/19/2017 Document Reviewed: 06/19/2016 Else[employer] Patient Education  2020 [employer]   Breastfeeding  Choosing to breastfeed is one of the best decisions you can make for yourself and your baby. A change in hormones during pregnancy causes your breasts to make breast milk in your milk-producing  glands. Hormones prevent breast milk from being released before your baby is born. They also prompt milk flow after birth. Once breastfeeding has begun, thoughts of your baby, as well as his or her sucking or crying, can stimulate the release of milk from your milk-producing glands. Benefits of breastfeeding Research shows that breastfeeding offers many health benefits for infants and mothers. It also offers a cost-free and convenient way to feed your baby. For your baby  Your first milk (colostrum) helps your baby's digestive system to function better.  Special cells in your milk (antibodies) help your baby to fight off infections.  Breastfed babies are less likely to develop asthma, allergies, obesity, or type 2 diabetes. They are also at lower risk for sudden infant death syndrome (SIDS).  Nutrients in breast milk are better able to meet your baby's needs compared to infant formula.  Breast milk improves your baby's brain development. For you  Breastfeeding helps to create a very special bond between you and your baby.  Breastfeeding is convenient. Breast milk costs nothing and is always available at the correct temperature.  Breastfeeding helps to burn calories. It helps you to lose the weight that you gained during pregnancy.  Breastfeeding makes your uterus return faster to its size before pregnancy. It also slows bleeding (lochia) after you give birth.  Breastfeeding helps to lower your risk of developing type 2 diabetes, osteoporosis, rheumatoid arthritis, cardiovascular disease, and breast, ovarian, uterine, and endometrial cancer later in life. Breastfeeding basics Starting breastfeeding  Find a comfortable place to sit or lie down, with your neck and back well-supported.  Place a pillow or a rolled-up blanket under your baby to bring him or her to the level of your breast (if you are seated). Nursing pillows are specially designed to help support your arms and your baby while  you breastfeed.  Make sure that your baby's tummy (abdomen) is facing your abdomen.  Gently massage your breast. With your fingertips, massage from the outer edges of your breast inward toward   the nipple. This encourages milk flow. If your milk flows slowly, you may need to continue this action during the feeding.  Support your breast with 4 fingers underneath and your thumb above your nipple (make the letter "C" with your hand). Make sure your fingers are well away from your nipple and your baby's mouth.  Stroke your baby's lips gently with your finger or nipple.  When your baby's mouth is open wide enough, quickly bring your baby to your breast, placing your entire nipple and as much of the areola as possible into your baby's mouth. The areola is the colored area around your nipple. ? More areola should be visible above your baby's upper lip than below the lower lip. ? Your baby's lips should be opened and extended outward (flanged) to ensure an adequate, comfortable latch. ? Your baby's tongue should be between his or her lower gum and your breast.  Make sure that your baby's mouth is correctly positioned around your nipple (latched). Your baby's lips should create a seal on your breast and be turned out (everted).  It is common for your baby to suck about 2-3 minutes in order to start the flow of breast milk. Latching Teaching your baby how to latch onto your breast properly is very important. An improper latch can cause nipple pain, decreased milk supply, and poor weight gain in your baby. Also, if your baby is not latched onto your nipple properly, he or she may swallow some air during feeding. This can make your baby fussy. Burping your baby when you switch breasts during the feeding can help to get rid of the air. However, teaching your baby to latch on properly is still the best way to prevent fussiness from swallowing air while breastfeeding. Signs that your baby has successfully  latched onto your nipple  Silent tugging or silent sucking, without causing you pain. Infant's lips should be extended outward (flanged).  Swallowing heard between every 3-4 sucks once your milk has started to flow (after your let-down milk reflex occurs).  Muscle movement above and in front of his or her ears while sucking. Signs that your baby has not successfully latched onto your nipple  Sucking sounds or smacking sounds from your baby while breastfeeding.  Nipple pain. If you think your baby has not latched on correctly, slip your finger into the corner of your baby's mouth to break the suction and place it between your baby's gums. Attempt to start breastfeeding again. Signs of successful breastfeeding Signs from your baby  Your baby will gradually decrease the number of sucks or will completely stop sucking.  Your baby will fall asleep.  Your baby's body will relax.  Your baby will retain a small amount of milk in his or her mouth.  Your baby will let go of your breast by himself or herself. Signs from you  Breasts that have increased in firmness, weight, and size 1-3 hours after feeding.  Breasts that are softer immediately after breastfeeding.  Increased milk volume, as well as a change in milk consistency and color by the fifth day of breastfeeding.  Nipples that are not sore, cracked, or bleeding. Signs that your baby is getting enough milk  Wetting at least 1-2 diapers during the first 24 hours after birth.  Wetting at least 5-6 diapers every 24 hours for the first week after birth. The urine should be clear or pale yellow by the age of 5 days.  Wetting 6-8 diapers every 24 hours as   your baby continues to grow and develop.  At least 3 stools in a 24-hour period by the age of 5 days. The stool should be soft and yellow.  At least 3 stools in a 24-hour period by the age of 7 days. The stool should be seedy and yellow.  No loss of weight greater than 10% of  birth weight during the first 3 days of life.  Average weight gain of 4-7 oz (113-198 g) per week after the age of 4 days.  Consistent daily weight gain by the age of 5 days, without weight loss after the age of 2 weeks. After a feeding, your baby may spit up a small amount of milk. This is normal. Breastfeeding frequency and duration Frequent feeding will help you make more milk and can prevent sore nipples and extremely full breasts (breast engorgement). Breastfeed when you feel the need to reduce the fullness of your breasts or when your baby shows signs of hunger. This is called "breastfeeding on demand." Signs that your baby is hungry include:  Increased alertness, activity, or restlessness.  Movement of the head from side to side.  Opening of the mouth when the corner of the mouth or cheek is stroked (rooting).  Increased sucking sounds, smacking lips, cooing, sighing, or squeaking.  Hand-to-mouth movements and sucking on fingers or hands.  Fussing or crying. Avoid introducing a pacifier to your baby in the first 4-6 weeks after your baby is born. After this time, you may choose to use a pacifier. Research has shown that pacifier use during the first year of a baby's life decreases the risk of sudden infant death syndrome (SIDS). Allow your baby to feed on each breast as long as he or she wants. When your baby unlatches or falls asleep while feeding from the first breast, offer the second breast. Because newborns are often sleepy in the first few weeks of life, you may need to awaken your baby to get him or her to feed. Breastfeeding times will vary from baby to baby. However, the following rules can serve as a guide to help you make sure that your baby is properly fed:  Newborns (babies 4 weeks of age or younger) may breastfeed every 1-3 hours.  Newborns should not go without breastfeeding for longer than 3 hours during the day or 5 hours during the night.  You should breastfeed  your baby a minimum of 8 times in a 24-hour period. Breast milk pumping     Pumping and storing breast milk allows you to make sure that your baby is exclusively fed your breast milk, even at times when you are unable to breastfeed. This is especially important if you go back to work while you are still breastfeeding, or if you are not able to be present during feedings. Your lactation consultant can help you find a method of pumping that works best for you and give you guidelines about how long it is safe to store breast milk. Caring for your breasts while you breastfeed Nipples can become dry, cracked, and sore while breastfeeding. The following recommendations can help keep your breasts moisturized and healthy:  Avoid using soap on your nipples.  Wear a supportive bra designed especially for nursing. Avoid wearing underwire-style bras or extremely tight bras (sports bras).  Air-dry your nipples for 3-4 minutes after each feeding.  Use only cotton bra pads to absorb leaked breast milk. Leaking of breast milk between feedings is normal.  Use lanolin on your nipples   after breastfeeding. Lanolin helps to maintain your skin's normal moisture barrier. Pure lanolin is not harmful (not toxic) to your baby. You may also hand express a few drops of breast milk and gently massage that milk into your nipples and allow the milk to air-dry. In the first few weeks after giving birth, some women experience breast engorgement. Engorgement can make your breasts feel heavy, warm, and tender to the touch. Engorgement peaks within 3-5 days after you give birth. The following recommendations can help to ease engorgement:  Completely empty your breasts while breastfeeding or pumping. You may want to start by applying warm, moist heat (in the shower or with warm, water-soaked hand towels) just before feeding or pumping. This increases circulation and helps the milk flow. If your baby does not completely empty your  breasts while breastfeeding, pump any extra milk after he or she is finished.  Apply ice packs to your breasts immediately after breastfeeding or pumping, unless this is too uncomfortable for you. To do this: ? Put ice in a plastic bag. ? Place a towel between your skin and the bag. ? Leave the ice on for 20 minutes, 2-3 times a day.  Make sure that your baby is latched on and positioned properly while breastfeeding. If engorgement persists after 48 hours of following these recommendations, contact your health care provider or a lactation consultant. Overall health care recommendations while breastfeeding  Eat 3 healthy meals and 3 snacks every day. Well-nourished mothers who are breastfeeding need an additional 450-500 calories a day. You can meet this requirement by increasing the amount of a balanced diet that you eat.  Drink enough water to keep your urine pale yellow or clear.  Rest often, relax, and continue to take your prenatal vitamins to prevent fatigue, stress, and low vitamin and mineral levels in your body (nutrient deficiencies).  Do not use any products that contain nicotine or tobacco, such as cigarettes and e-cigarettes. Your baby may be harmed by chemicals from cigarettes that pass into breast milk and exposure to secondhand smoke. If you need help quitting, ask your health care provider.  Avoid alcohol.  Do not use illegal drugs or marijuana.  Talk with your health care provider before taking any medicines. These include over-the-counter and prescription medicines as well as vitamins and herbal supplements. Some medicines that may be harmful to your baby can pass through breast milk.  It is possible to become pregnant while breastfeeding. If birth control is desired, ask your health care provider about options that will be safe while breastfeeding your baby. Where to find more information: La Leche League International: www.llli.org Contact a health care provider  if:  You feel like you want to stop breastfeeding or have become frustrated with breastfeeding.  Your nipples are cracked or bleeding.  Your breasts are red, tender, or warm.  You have: ? Painful breasts or nipples. ? A swollen area on either breast. ? A fever or chills. ? Nausea or vomiting. ? Drainage other than breast milk from your nipples.  Your breasts do not become full before feedings by the fifth day after you give birth.  You feel sad and depressed.  Your baby is: ? Too sleepy to eat well. ? Having trouble sleeping. ? More than 1 week old and wetting fewer than 6 diapers in a 24-hour period. ? Not gaining weight by 5 days of age.  Your baby has fewer than 3 stools in a 24-hour period.  Your baby's skin or   the white parts of his or her eyes become yellow. Get help right away if:  Your baby is overly tired (lethargic) and does not want to wake up and feed.  Your baby develops an unexplained fever. Summary  Breastfeeding offers many health benefits for infant and mothers.  Try to breastfeed your infant when he or she shows early signs of hunger.  Gently tickle or stroke your baby's lips with your finger or nipple to allow the baby to open his or her mouth. Bring the baby to your breast. Make sure that much of the areola is in your baby's mouth. Offer one side and burp the baby before you offer the other side.  Talk with your health care provider or lactation consultant if you have questions or you face problems as you breastfeed. This information is not intended to replace advice given to you by your health care provider. Make sure you discuss any questions you have with your health care provider. Document Revised: 08/11/2017 Document Reviewed: 06/18/2016 Elsevier Patient Education  2020 [employer].   Vaginal Birth After Cesarean Delivery  Vaginal birth after cesarean delivery (VBAC) is giving birth vaginally after previously delivering a baby through a  cesarean section (C-section). A VBAC may be a safe option for you, depending on your health and other factors. It is important to discuss VBAC with your health care provider early in your pregnancy so you can understand the risks, benefits, and options. Having these discussions early will give you time to make your birth plan. Who are the best candidates for VBAC? The best candidates for VBAC are women who:  Have had one or two prior cesarean deliveries, and the incision made during the delivery was horizontal (low transverse).  Do not have a vertical (classical) scar on their uterus.  Have not had a tear in the wall of their uterus (uterine rupture).  Plan to have more pregnancies. A VBAC is also more likely to be successful:  In women who have previously given birth vaginally.  When labor starts by itself (spontaneously) before the due date. What are the benefits of VBAC? The benefits of delivering your baby vaginally instead of by a cesarean delivery include:  A shorter hospital stay.  A faster recovery time.  Less pain.  Avoiding risks associated with major surgery, such as infection and blood clots.  Less blood loss and less need for donated blood (transfusions). What are the risks of VBAC? The main risk of attempting a VBAC is that it may fail, forcing your health care provider to deliver your baby by a C-section. Other risks are rare and include:  Tearing (rupture) of the scar from a past cesarean delivery.  Other risks associated with vaginal deliveries. If a repeat cesarean delivery is needed, the risks include:  Blood loss.  Infection.  Blood clot.  Damage to surrounding organs.  Removal of the uterus (hysterectomy), if it is damaged.  Placenta problems in future pregnancies. What else should I know about my options? Delivering a baby through a VBAC is similar to having a normal spontaneous vaginal delivery. Therefore, it is safe:  To try with  twins.  For your health care provider to try to turn the baby from a breech position (external cephalic version) during labor.  With epidural analgesia for pain relief. Consider where you would like to deliver your baby. VBAC should be attempted in facilities where an emergency cesarean delivery can be performed. VBAC is not recommended for home births. Any  changes in your health or your baby's health during your pregnancy may make it necessary to change your initial decision about VBAC. Your health care provider may recommend that you do not attempt a VBAC if:  Your baby's suspected weight is 8.8 lb (4 kg) or more.  You have preeclampsia. This is a condition that causes high blood pressure along with other symptoms, such as swelling and headaches.  You will have VBAC less than 19 months after your cesarean delivery.  You are past your due date.  You need to have labor started (induced) because your cervix is not ready for labor (unfavorable). Where to find more information  American Pregnancy Association: americanpregnancy.org  [employer] of Obstetricians and Gynecologists: acog.org Summary  Vaginal birth after cesarean delivery (VBAC) is giving birth vaginally after previously delivering a baby through a cesarean section (C-section). A VBAC may be a safe option for you, depending on your health and other factors.  Discuss VBAC with your health care provider early in your pregnancy so you can understand the risks, benefits, options, and have plenty of time to make your birth plan.  The main risk of attempting a VBAC is that it may fail, forcing your health care provider to deliver your baby by a C-section. Other risks are rare. This information is not intended to replace advice given to you by your health care provider. Make sure you discuss any questions you have with your health care provider. Document Revised: 09/12/2018 Document Reviewed: 08/24/2016 Elsevier Patient  Education  2020 [employer].  [facility] [address] [place] [phone]  [facility] [address] [phone] [facility] [address] [phone]  [facility] Main: [address] [phone] West: [address] [facility] [phone]  [facility] [address], [place] [phone]  [facility] [address], [place] [phone]  [facility] [address], [place] [phone]  [facility] [place]: [address] [place] [phone] Kathryne Sharper: [address] [place], [address] [place] [phone] [place]: [address] [phone] [place]: [address] [phone]  [facility] [address] [phone]  [facility] [address], [place] [phone]  [facility] [address] [place], [place] [phone]  [facility] [address], [place] [phone]  [facility] [address], [place] [phone]  [facility] [address] [place], [place] [phone]  [facility] [address], [place] [phone]  [facility]) FM @ [place]: [address] [place] [phone] FM @ [place]: [address], [place] [phone] Peds @ [address] [place]: [address] [place], [place] [phone] Peds @ [place]: [address], [place] [phone] ext [phone] [facility] [address], [place] [phone]  [facility] [address], [place] [phone]  Private Pediatrician Cyril Mourning, MD [address] [place], [place] [phone] Lucio Edward, MD [address], [place] [phone] Maryellen Pile,  MD [address] [phone] Dellis Filbert, MD [facility]) [address], [place] [phone]        Parto vaginal despus de un parto por cesrea Vaginal Birth After Cesarean Delivery  Un parto vaginal despus de un parto por cesrea (PVDC) es dar a luz por la vagina despus de haber dado a luz por medio de una intervencin quirrgica llamada cesrea.  Es posible que el PVDC sea una alternativa segura para usted, segn su salud y Chief Technology Officer. Es importante que hable con su mdico acerca del PVDC desde comienzos del Psychiatrist de modo que pueda comprender los riesgos, beneficios y opciones. Hablar sobre estos temas desde el comienzo le permitir tener tiempo para Human resources officer. Quines son las mejores candidatas para Wilburt Finlay un PVDC? Las mejores candidatas para Warehouse manager un PVDC son las mujeres que cumplen con los siguientes requisitos:  Tuvieron uno o dos partos por Education officer, environmental, y las incisiones realizadas durante los partos fueron horizontales (transversales bajas).  No tienen una cicatriz uterina vertical (clsica).  No han tenido una ruptura en la pared del tero (ruptura uterina).  Piensan tener otros embarazos. Es ms probable que un PVDC se realice correctamente en los siguientes casos:  Mujeres que ya tuvieron partos vaginales antes.  Cuando el trabajo de parto comienza sin ninguna intervencin (de forma espontnea) antes de la fecha prevista. Cules son los beneficios del PVDC? Algunos de los beneficios de que el beb nazca por parto vaginal en lugar de por cesrea:  Estada ms corta en el hospital.  Menor tiempo de recuperacin.  Menos dolor.  Evitar los [employer]gate Palmolive a las [address], como las infecciones y los cogulos de [place].  Menor prdida de [place] y Adult nurse probabilidad de Pension scheme manager sangre de un donante (transfusin). Cules son los riesgos del PVDC? El principal riesgo de Estate manager/land agent un PVDC  es que existe una posibilidad de que [place], lo que obligar al mdico a Education officer, environmental una cesrea. Los otros riesgos son muy poco frecuentes y pueden ser los siguientes:  Desgarro (ruptura) de la cicatriz de un parto por cesrea anterior.  Otros riesgos asociados con los [employer]. Si se debe repetir un parto por cesrea, los riesgos [employer] siguientes:  Prdida de [place].  Infeccin.  Cogulos de [place].  Lesin en los rganos circundantes.  Extirpacin del tero (histerectoma), si se daa.  Problemas de placenta en embarazos futuros. Qu ms debo saber sobre las alternativas? El parto vaginal despus de Eustace Quail es similar a un parto espontneo vaginal normal. Por lo tanto, es seguro:  Intentarlo cuando se trata de gemelos.  Que el mdico intente girar al beb si se encuentra de nalgas (versin ceflica externa) durante el trabajo de [place].  Colocar anestesia epidural para Engineer, materials. Considerar posibilidades sobre dnde le gustara que ocurriera el [place]. El PVDC debe llevarse a cabo en centros donde se pueda realizar un parto por cesrea de urgencia. No se recomiendan los partos domiciliarios si planea tener un PVDC. Cualquier cambio en su salud o la de su beb durante el embarazo puede ser motivo de un cambio de decisin respecto del parto vaginal. El mdico podra recomendarle no realizar un PVDC en los siguientes casos:  El beb parece pesar 8.8lb (4kg) o ms.  Tiene preeclampsia. Esta es una afeccin que provoca el aumento de la presin arterial y otros sntomas, como hinchazn y dolores de [place].  Tendr un PVDC menos de despus de un parto por cesrea.  Ya pas la fecha prevista del [place].  Necesita que se inicie el trabajo de parto (induccin) porque el cuello uterino no est listo para el parto (desfavorable). Dnde encontrar ms informacin  Asociacin Americana del Psychiatrist (American Pregnancy Association):  bitchilla.com.  Congreso Estadounidense de Obstetras y Insurance claims handler of Obstetricians and Gynecologists): acog.org Resumen  Un parto vaginal despus de un parto por cesrea (PVDC) es dar  a luz por la vagina despus de haber dado a luz por medio de una intervencin quirrgica llamada cesrea. Es posible que el PVDC sea una alternativa segura para usted, segn su salud y Chief Technology Officerotros factores.  Hable con el mdico desde comienzos del embarazo para que pueda Googlecomprender los riesgos, los beneficios y las Billingsalternativas, y Warehouse managertener mucho tiempo para Human resources officerplanificar el parto.  El principal riesgo de Estate manager/land agentintentar tener un PVDC es que existe una posibilidad de que [place], lo que obligar al mdico a Education officer, environmentalrealizar una cesrea. Los otros riesgos son muy poco frecuentes. Esta informacin no tiene Theme park managercomo fin reemplazar el consejo del mdico. Asegrese de hacerle al mdico cualquier pregunta que tenga. Document Revised: 12/21/2016 Document Reviewed: 12/21/2016 Elsevier Patient Education  2020 [employer].  Southern CompanyEleccin del mtodo anticonceptivo Contraception Choices La anticoncepcin, o los mtodos anticonceptivos, son medidas que se toman o Slickmaneras a fin de intentar no quedar embarazada. Mtodo anticonceptivo hormonal Este tipo de mtodo anticonceptivo contiene hormonas. A continuacin se mencionan algunos tipos de mtodos anticonceptivos hormonales:  Un tubo que se coloca debajo de la piel del brazo (implante). El tubo Clinical biochemistpuede permanecer en el lugar durante 3aos.  Inyecciones que debe recibir cada 3meses.  Pldoras que debe tomar CarMaxtodos los das (pldoras anticonceptivas).  Un parche que se debe cambiar 1vez por semana durante 3semanas (parche anticonceptivo). Despus de SYSCOese tiempo, el parche se debe retirar durante 1semana.  Un anillo que se coloca en la vagina. El anillo se deja colocado durante 3 semanas. Luego, se debe retirar de Sports administratorla vagina durante 1semana. Luego, se coloca un nuevo anillo en la  vagina.  Pldoras que se deben tomar despus de tener sexo sin proteccin (pldoras anticonceptivas de emergencia). Mtodos anticonceptivos de barrera A continuacin se mencionan algunos tipos de mtodos anticonceptivos de barrera:  Una cubierta delgada que se coloca sobre el pene antes de tener sexo (preservativo masculino). La cubierta se desecha despus de eBaytener sexo.  Una cubierta blanda y suelta que se coloca en la vagina antes de tener sexo (preservativo femenino). La cubierta se desecha despus de eBaytener sexo.  Un dispositivo de goma que se aplica sobre el cuello uterino (diafragma). Este dispositivo debe fabricarse para usted. Se coloca en la vagina antes de tener sexo. Se debe dejar colocado durante 6 a 8horas despus de eBaytener sexo. Se debe retirar en un plazo de 24horas.  Un capuchn pequeo y Du Pontsuave que se fija sobre el cuello uterino (capuchn cervical). Este capuchn debe fabricarse para usted. Se debe dejar colocado durante 6 a 8horas despus de eBaytener sexo. Se debe retirar en un plazo de 48horas.  Una esponja que se coloca en la vagina antes de tener sexo. Se debe dejar colocada durante al menos 6horas despus de eBaytener sexo. Se debe retirar en un plazo de 30horas. Luego, debe desecharse.  Una sustancia qumica que destruye o impide que los espermatozoides ingresen al tero (espermicida). Se puede presentar en forma de pldora, crema, gel o espuma y se debe colocar en la vagina. La sustancia qumica se debe usar al Lowe's Companiesmenos de 10 a 15minutos antes de eBaytener sexo. Dispositivo anticonceptivo intrauterino (DIU) El DIU es un pequeo dispositivo plstico en forma de T. Se coloca en el interior del tero. Existen dos tipos diferentes:  DIU hormonal. Este tipo puede permanecer colocado durante 3 a 5aos.  DIU de cobre. Este tipo Insurance claims handlerpuede permanecer colocado durante 10aos. Mtodos anticonceptivos permanentes A continuacin se mencionan algunos tipos de mtodos anticonceptivos  permanentes:  [place] para obstruir las  trompas de Nordstrom.  Colocacin de un dispositivo en cada una de las trompas de [place].  [place] para atar los conductos que transportan el esperma (vasectoma). Mtodos anticonceptivos por planificacin natural A continuacin se mencionan algunos mtodos anticonceptivos por planificacin natural:  No tener [employer] frtiles de la [place].  Usar un calendario a fin de: ? Llevar un registro de la duracin de cada perodo. ? Determinar en [employer] se podra producir Firefighter. ? Planificar no tener [employer] en que se podra producir Firefighter.  Reconocer los sntomas de la ovulacin y no tener sexo durante la ovulacin. Craig Staggers en que la mujer puede detectar la ovulacin es controlarse la temperatura.  Esperar para tener sexo hasta despus de la ovulacin. Resumen  La anticoncepcin, o los mtodos anticonceptivos, son medidas que se toman o [place] a fin de intentar no quedar [place].  Los mtodos anticonceptivos hormonales incluyen implantes, inyecciones, pldoras, parches, anillos vaginales y pldoras anticonceptivas de Associate Professor.  Los mtodos anticonceptivos de barrera pueden incluir preservativos masculinos, preservativos femeninos, diafragmas, capuchones cervicales, esponjas y espermicidas.  [employer] tipos diferentes de DIU (dispositivo intrauterino) anticonceptivo. Un DIU puede colocarse en el tero de una mujer para evitar el embarazo durante 3 a 5 aos.  La esterilizacin permanente puede realizarse mediante un procedimiento para hombres, mujeres o ambos.  Los mtodos anticonceptivos por Medical sales representative natural incluyen no tener [employer] frtiles de la [place]. Esta informacin no tiene Theme park manager el consejo del mdico. Asegrese de hacerle al mdico cualquier pregunta que tenga. Document Revised: 01/18/2018 Document Reviewed: 01/05/2017 Elsevier Patient Education  2020 [employer].

## 2019-12-16 LAB — CULTURE, OB URINE

## 2019-12-16 LAB — URINE CULTURE, OB REFLEX

## 2019-12-17 ENCOUNTER — Other Ambulatory Visit: Payer: Self-pay | Admitting: Obstetrics and Gynecology

## 2019-12-17 DIAGNOSIS — O0990 Supervision of high risk pregnancy, unspecified, unspecified trimester: Secondary | ICD-10-CM

## 2019-12-17 NOTE — Progress Notes (Signed)
Patient was assessed and managed by nursing staff during this encounter. I have reviewed the chart and agree with the documentation and plan. I have also made any necessary editorial changes.  Catalina Antigua, MD 12/17/2019 9:11 AM

## 2019-12-18 LAB — CYTOLOGY - PAP
Chlamydia: NEGATIVE
Comment: NEGATIVE
Comment: NEGATIVE
Comment: NORMAL
Diagnosis: NEGATIVE
High risk HPV: NEGATIVE
Neisseria Gonorrhea: NEGATIVE

## 2019-12-19 ENCOUNTER — Other Ambulatory Visit: Payer: Self-pay

## 2019-12-19 ENCOUNTER — Ambulatory Visit: Payer: 59 | Attending: Obstetrics and Gynecology

## 2019-12-19 ENCOUNTER — Ambulatory Visit: Payer: 59 | Admitting: *Deleted

## 2019-12-19 ENCOUNTER — Other Ambulatory Visit: Payer: Self-pay | Admitting: *Deleted

## 2019-12-19 DIAGNOSIS — O0990 Supervision of high risk pregnancy, unspecified, unspecified trimester: Secondary | ICD-10-CM

## 2019-12-19 DIAGNOSIS — Z789 Other specified health status: Secondary | ICD-10-CM

## 2019-12-19 DIAGNOSIS — O09299 Supervision of pregnancy with other poor reproductive or obstetric history, unspecified trimester: Secondary | ICD-10-CM

## 2019-12-19 DIAGNOSIS — O09522 Supervision of elderly multigravida, second trimester: Secondary | ICD-10-CM

## 2019-12-19 DIAGNOSIS — O09529 Supervision of elderly multigravida, unspecified trimester: Secondary | ICD-10-CM | POA: Insufficient documentation

## 2019-12-19 DIAGNOSIS — Z3A25 25 weeks gestation of pregnancy: Secondary | ICD-10-CM | POA: Diagnosis not present

## 2019-12-19 DIAGNOSIS — O34219 Maternal care for unspecified type scar from previous cesarean delivery: Secondary | ICD-10-CM

## 2020-01-07 ENCOUNTER — Encounter: Payer: Self-pay | Admitting: *Deleted

## 2020-01-10 ENCOUNTER — Encounter: Payer: Self-pay | Admitting: General Practice

## 2020-01-10 ENCOUNTER — Other Ambulatory Visit: Payer: Self-pay

## 2020-01-10 ENCOUNTER — Ambulatory Visit (INDEPENDENT_AMBULATORY_CARE_PROVIDER_SITE_OTHER): Payer: 59 | Admitting: Obstetrics and Gynecology

## 2020-01-10 ENCOUNTER — Other Ambulatory Visit: Payer: Self-pay | Admitting: *Deleted

## 2020-01-10 ENCOUNTER — Encounter: Payer: Self-pay | Admitting: Obstetrics and Gynecology

## 2020-01-10 ENCOUNTER — Other Ambulatory Visit: Payer: Self-pay | Admitting: Obstetrics and Gynecology

## 2020-01-10 ENCOUNTER — Encounter: Payer: Self-pay | Admitting: Family Medicine

## 2020-01-10 ENCOUNTER — Other Ambulatory Visit: Payer: 59

## 2020-01-10 ENCOUNTER — Other Ambulatory Visit (INDEPENDENT_AMBULATORY_CARE_PROVIDER_SITE_OTHER): Payer: 59

## 2020-01-10 VITALS — BP 129/72 | HR 70 | Wt 120.0 lb

## 2020-01-10 DIAGNOSIS — O43193 Other malformation of placenta, third trimester: Secondary | ICD-10-CM

## 2020-01-10 DIAGNOSIS — O0993 Supervision of high risk pregnancy, unspecified, third trimester: Secondary | ICD-10-CM

## 2020-01-10 DIAGNOSIS — Z789 Other specified health status: Secondary | ICD-10-CM

## 2020-01-10 DIAGNOSIS — Z3A28 28 weeks gestation of pregnancy: Secondary | ICD-10-CM

## 2020-01-10 DIAGNOSIS — O0990 Supervision of high risk pregnancy, unspecified, unspecified trimester: Secondary | ICD-10-CM | POA: Diagnosis not present

## 2020-01-10 DIAGNOSIS — Z23 Encounter for immunization: Secondary | ICD-10-CM | POA: Diagnosis not present

## 2020-01-10 DIAGNOSIS — O09293 Supervision of pregnancy with other poor reproductive or obstetric history, third trimester: Secondary | ICD-10-CM

## 2020-01-10 DIAGNOSIS — Q513 Bicornate uterus: Secondary | ICD-10-CM

## 2020-01-10 DIAGNOSIS — O09299 Supervision of pregnancy with other poor reproductive or obstetric history, unspecified trimester: Secondary | ICD-10-CM

## 2020-01-10 DIAGNOSIS — O34219 Maternal care for unspecified type scar from previous cesarean delivery: Secondary | ICD-10-CM

## 2020-01-10 DIAGNOSIS — Z8759 Personal history of other complications of pregnancy, childbirth and the puerperium: Secondary | ICD-10-CM

## 2020-01-10 DIAGNOSIS — O43199 Other malformation of placenta, unspecified trimester: Secondary | ICD-10-CM

## 2020-01-10 DIAGNOSIS — Z7189 Other specified counseling: Secondary | ICD-10-CM

## 2020-01-10 DIAGNOSIS — Z603 Acculturation difficulty: Secondary | ICD-10-CM

## 2020-01-10 DIAGNOSIS — Z98891 History of uterine scar from previous surgery: Secondary | ICD-10-CM

## 2020-01-10 DIAGNOSIS — O09523 Supervision of elderly multigravida, third trimester: Secondary | ICD-10-CM

## 2020-01-10 LAB — POCT URINALYSIS DIP (DEVICE)
Bilirubin Urine: NEGATIVE
Glucose, UA: NEGATIVE mg/dL
Hgb urine dipstick: NEGATIVE
Ketones, ur: NEGATIVE mg/dL
Leukocytes,Ua: NEGATIVE
Nitrite: NEGATIVE
Protein, ur: NEGATIVE mg/dL
Specific Gravity, Urine: 1.025 (ref 1.005–1.030)
Urobilinogen, UA: 0.2 mg/dL (ref 0.0–1.0)
pH: 6 (ref 5.0–8.0)

## 2020-01-10 NOTE — Progress Notes (Addendum)
PRENATAL VISIT NOTE  Subjective:  Kristina Gordon is a 39 y.o. G3P1011 at [redacted]w[redacted]d being seen today for ongoing prenatal care.  She is currently monitored for the following issues for this high-risk pregnancy and has Language barrier; Syncope; AMA (advanced maternal age) multigravida 35+; SS (short stature); S/P cesarean section; Supervision of high risk pregnancy, antepartum; History of HELLP syndrome, currently pregnant; Bicornuate uterus; and Marginal insertion of umbilical cord affecting management of mother on their problem list.  Patient reports no complaints.  Contractions: Not present. Vag. Bleeding: None.  Movement: Present. Denies leaking of fluid.   The following portions of the patient's history were reviewed and updated as appropriate: allergies, current medications, past family history, past medical history, past social history, past surgical history and problem list.   Objective:   Vitals:   01/10/20 0838  BP: 129/72  Pulse: 70  Weight: 120 lb (54.4 kg)    Fetal Status: Fetal Heart Rate (bpm): 137   Movement: Present     General:  Alert, oriented and cooperative. Patient is in no acute distress.  Skin: Skin is warm and dry. No rash noted.   Cardiovascular: Normal heart rate noted  Respiratory: Normal respiratory effort, no problems with respiration noted  Abdomen: Soft, gravid, appropriate for gestational age.  Pain/Pressure: Absent     Pelvic: Cervical exam deferred        Extremities: Normal range of motion.  Edema: None  Mental Status: Normal mood and affect. Normal behavior. Normal judgment and thought content.   Assessment and Plan:  Pregnancy: G3P1011 at [redacted]w[redacted]d  1. History of HELLP syndrome, currently pregnant Cont baby ASA  2. Supervision of high risk pregnancy, antepartum Testing at 36 weeks  3. S/P cesarean section Reviewed risks/benefits of TOLAC versus RCS in detail. Patient counseled regarding potential vaginal delivery, chance of success, future  implications, possible uterine rupture and need for urgent/emergent repeat cesarean. Counseled regarding potential need for repeat c-section for reasons unrelated to first c-section. Counseled regarding scheduled repeat cesarean including risks of bleeding, infection, damage to surrounding tissue, abnormal placentation, implications for future pregnancies. All questions answered.  Patient desires TOLAC, consent signed 01/10/2020.  4. Language barrier Spanish translator used  5. Multigravida of advanced maternal age in third trimester  6. Bicornuate uterus Monitor via [place]  7. Marginal insertion of umbilical cord affecting management of mother Regular growth [place]  8. Counseled about COVID-19 virus infection The patient was counseled on the potential benefits and lack of known risks of COVID vaccination, during pregnancy and breastfeeding, on today's visit. The patient's questions and concerns were addressed today, including worries about side effects and long term effects. The patient is still unsure of her decision for vaccination. The patient is aware that if she chooses not to get an employee mandated vaccination we will provide documentation for her employers in the form of a letter unless a specific exemption form is submitted to the provider.   9. [redacted] weeks gestation of pregnancy   Preterm labor symptoms and general obstetric precautions including but not limited to vaginal bleeding, contractions, leaking of fluid and fetal movement were reviewed in detail with the patient. Please refer to After Visit Summary for other counseling recommendations.   Return in about 2 weeks (around 01/24/2020) for high OB, in person.  Future Appointments  Date Time Provider Department Center  01/16/2020  8:30 AM [facility] NURSE [facility] [address] [facility]  01/16/2020  8:45 AM [facility]5 [facility]    Conan Bowens, MD

## 2020-01-10 NOTE — Progress Notes (Signed)
Patient reports seeing "flashing spots" in the shower - occurs no other time.

## 2020-01-10 NOTE — Patient Instructions (Signed)
https://www.cdc.gov/vaccines/hcp/vis/vis-statements/tdap.pdf">  °Vacuna Tdap (tétanos, difteria y tos ferina): lo que debe saber °Tdap (Tetanus, Diphtheria, Pertussis) Vaccine: What You Need to Know °1. ¿Por qué vacunarse? °La vacuna Tdap puede prevenir el tétanos, la difteria y la tos ferina. °La difteria y la tos ferina se contagian de persona a persona. El tétanos ingresa al organismo a través de cortes o heridas. °· El TÉTANOS (T) provoca rigidez dolorosa en los músculos. El tétanos puede causar graves problemas de salud, como no poder abrir la boca, tener dificultad para tragar y respirar, o la muerte. °· La DIFTERIA (D) puede causar dificultad para respirar, insuficiencia cardíaca, parálisis o muerte. °· La TOS FERINA (aP) también conocida como “tos convulsa” puede causar tos violenta e incontrolable lo que hace difícil respirar, comer o beber. La tos ferina puede ser muy grave en los bebés y en los niños pequeños, y causar neumonía, convulsiones, daño cerebral o la muerte. En adolescentes y adultos, puede causar pérdida de peso, pérdida del control de la vejiga, desmayos y fracturas de costillas al toser de manera intensa. °2. Vacuna Tdap °La vacuna Tdap es solo para niños de 7 años en adelante, adolescentes y adultos.  °Los adolescentes debe recibir una dosis única de la vacuna Tdap, preferentemente a los 11 o 12 años. °Las mujeres embarazadas deben recibir una dosis de la vacuna Tdap en cada embarazo para proteger al recién nacido de la tos ferina. Los bebés tienen mayor riesgo de sufrir complicaciones graves y potencialmente mortales debido a la tos ferina. °Los adultos que nunca recibieron la vacuna Tdap deben recibir una dosis. °Además, los adultos deben recibir una dosis de refuerzo cada 10 años, o antes si la persona sufre una quemadura o una herida grave y sucia. Las dosis de refuerzo pueden ser de la vacuna Tdap o la Td (una vacuna diferente que protege contra el tétanos y la difteria, pero no contra  la tos ferina). °La vacuna Tdap puede ser administrada al mismo tiempo que otras vacunas. °3. Hable con el médico °Comuníquese con la persona que le coloca las vacunas si la persona que la recibe: °· Ha tenido una reacción alérgica después de una dosis anterior de cualquier vacuna contra el tétanos, la difteria o la tos ferina, o cualquier alergia grave, potencialmente mortal. °· Ha tenido un coma, disminución del nivel de la conciencia o convulsiones prolongadas dentro de los 7 días posteriores a una dosis anterior de cualquier vacuna contra la tos ferina (DTP, DTaP o Tdap). °· Tiene convulsiones u otro problema del sistema nervioso. °· Alguna vez tuvo síndrome de Guillain-Barré (también llamado SGB). °· Ha tenido dolor intenso o hinchazón después de una dosis anterior de cualquier vacuna contra el tétanos o la difteria. °En algunos casos, es posible que el médico decida posponer la aplicación de la vacuna Tdap para una visita en el futuro.  °Las personas que sufren trastornos menores, como un resfrío, pueden vacunarse. Las personas que tienen enfermedades moderadas o graves generalmente deben esperar hasta recuperarse para poder recibir la vacuna Tdap.  °Su médico puede darle más información. °4. Riesgos de una reacción a la vacuna °· Después de recibir la vacuna Tdap a veces se puede tener dolor, enrojecimiento o hinchazón en el lugar donde se aplicó la inyección, fiebre leve, dolor de cabeza, sensación de cansancio y náuseas, vómitos, diarrea o dolor de estómago. °Las personas a veces se desmayan después de procedimientos médicos, incluida la vacunación. Informe al médico si se siente mareado, tiene cambios en la visión o zumbidos   en los oídos.  °Al igual que con cualquier medicamento, existe una probabilidad muy remota de que una vacuna cause una reacción alérgica grave, otra lesión grave o la muerte. °5. ¿Qué pasa si se presenta un problema grave? °Podría producirse una reacción alérgica después de que la  persona vacunada abandone la clínica. Si observa signos de una reacción alérgica grave (ronchas, hinchazón de la cara y la garganta, dificultad para respirar, latidos cardíacos acelerados, mareos o debilidad), llame al 9-1-1 y lleve a la persona al hospital más cercano. °Si se presentan otros signos que le preocupan, comuníquese con su médico.  °Las reacciones adversas deben informarse al Sistema de Informe de Eventos Adversos de Vacunas (Vaccine Adverse Event Reporting System, VAERS). Por lo general, el médico presenta este informe o puede hacerlo usted mismo. Visite el sitio web del VAERS en www.vaers.hhs.gov o llame al 1-800-822-7967. El VAERS es solo para informar reacciones; su personal no proporciona asesoramiento médico. °6. Programa Nacional de Compensación de Daños por Vacunas °El Programa Nacional de Compensación de Daños por Vacunas (National Vaccine Injury Compensation Program, VICP) es un programa federal que fue creado para compensar a las personas que puedan haber sufrido daños al recibir ciertas vacunas. Visite el sitio web del VICP en www.hrsa.gov/vaccinecompensation o llame al 1-800-338-2382 para obtener más información acerca del programa y de cómo presentar un reclamo. Hay un límite de tiempo para presentar un reclamo de compensación. °7. ¿Cómo puedo obtener más información? °· Pregúntele a su médico. °· Comuníquese con el servicio de salud de su localidad o su estado. °· Comuníquese con los Centers for Disease Control and Prevention, CDC (Centros para el Control y la Prevención de Enfermedades): °? Llame al 1-800-232-4636 (1-800-CDC-INFO) o °? Visite el sitio web de los CDC en www.cdc.gov/vaccines °Declaración de información de la vacuna Tdap (tétanos, difteria y tos ferina) (06/03/2018) °Esta información no tiene como fin reemplazar el consejo del médico. Asegúrese de hacerle al médico cualquier pregunta que tenga. °Document Revised: 09/26/2018 Document Reviewed: 09/26/2018 °Elsevier Patient  Education © 2020 Elsevier Inc. ° ° °

## 2020-01-11 LAB — CBC
Hematocrit: 33.8 % — ABNORMAL LOW (ref 34.0–46.6)
Hemoglobin: 11.3 g/dL (ref 11.1–15.9)
MCH: 31.6 pg (ref 26.6–33.0)
MCHC: 33.4 g/dL (ref 31.5–35.7)
MCV: 94 fL (ref 79–97)
Platelets: 245 10*3/uL (ref 150–450)
RBC: 3.58 x10E6/uL — ABNORMAL LOW (ref 3.77–5.28)
RDW: 12.6 % (ref 11.7–15.4)
WBC: 7 10*3/uL (ref 3.4–10.8)

## 2020-01-11 LAB — GLUCOSE TOLERANCE, 2 HOURS W/ 1HR
Glucose, 1 hour: 82 mg/dL (ref 65–179)
Glucose, 2 hour: 89 mg/dL (ref 65–152)
Glucose, Fasting: 77 mg/dL (ref 65–91)

## 2020-01-11 LAB — RPR: RPR Ser Ql: NONREACTIVE

## 2020-01-11 LAB — HIV ANTIBODY (ROUTINE TESTING W REFLEX): HIV Screen 4th Generation wRfx: NONREACTIVE

## 2020-01-16 ENCOUNTER — Ambulatory Visit: Payer: 59 | Admitting: *Deleted

## 2020-01-16 ENCOUNTER — Other Ambulatory Visit: Payer: Self-pay | Admitting: *Deleted

## 2020-01-16 ENCOUNTER — Ambulatory Visit: Payer: 59 | Attending: Obstetrics and Gynecology

## 2020-01-16 ENCOUNTER — Other Ambulatory Visit: Payer: Self-pay

## 2020-01-16 DIAGNOSIS — Q513 Bicornate uterus: Secondary | ICD-10-CM | POA: Diagnosis not present

## 2020-01-16 DIAGNOSIS — O43199 Other malformation of placenta, unspecified trimester: Secondary | ICD-10-CM | POA: Diagnosis present

## 2020-01-16 DIAGNOSIS — O0990 Supervision of high risk pregnancy, unspecified, unspecified trimester: Secondary | ICD-10-CM | POA: Diagnosis present

## 2020-01-16 DIAGNOSIS — O34593 Maternal care for other abnormalities of gravid uterus, third trimester: Secondary | ICD-10-CM | POA: Diagnosis not present

## 2020-01-16 DIAGNOSIS — O09299 Supervision of pregnancy with other poor reproductive or obstetric history, unspecified trimester: Secondary | ICD-10-CM | POA: Insufficient documentation

## 2020-01-16 DIAGNOSIS — Z362 Encounter for other antenatal screening follow-up: Secondary | ICD-10-CM

## 2020-01-16 DIAGNOSIS — Z3A29 29 weeks gestation of pregnancy: Secondary | ICD-10-CM

## 2020-01-16 DIAGNOSIS — O09523 Supervision of elderly multigravida, third trimester: Secondary | ICD-10-CM

## 2020-01-16 DIAGNOSIS — O34219 Maternal care for unspecified type scar from previous cesarean delivery: Secondary | ICD-10-CM | POA: Diagnosis not present

## 2020-01-16 DIAGNOSIS — O09529 Supervision of elderly multigravida, unspecified trimester: Secondary | ICD-10-CM | POA: Diagnosis not present

## 2020-01-28 ENCOUNTER — Other Ambulatory Visit: Payer: Self-pay

## 2020-01-28 ENCOUNTER — Ambulatory Visit (INDEPENDENT_AMBULATORY_CARE_PROVIDER_SITE_OTHER): Payer: 59 | Admitting: Family Medicine

## 2020-01-28 VITALS — BP 118/77 | HR 83 | Wt 121.0 lb

## 2020-01-28 DIAGNOSIS — Z789 Other specified health status: Secondary | ICD-10-CM

## 2020-01-28 DIAGNOSIS — Z98891 History of uterine scar from previous surgery: Secondary | ICD-10-CM

## 2020-01-28 DIAGNOSIS — Q513 Bicornate uterus: Secondary | ICD-10-CM

## 2020-01-28 DIAGNOSIS — O09299 Supervision of pregnancy with other poor reproductive or obstetric history, unspecified trimester: Secondary | ICD-10-CM

## 2020-01-28 DIAGNOSIS — O0990 Supervision of high risk pregnancy, unspecified, unspecified trimester: Secondary | ICD-10-CM

## 2020-01-28 DIAGNOSIS — O43199 Other malformation of placenta, unspecified trimester: Secondary | ICD-10-CM

## 2020-01-28 MED ORDER — ASPIRIN EC 81 MG PO TBEC
81.0000 mg | DELAYED_RELEASE_TABLET | Freq: Every day | ORAL | 2 refills | Status: DC
Start: 2020-01-28 — End: 2020-03-14

## 2020-01-28 NOTE — Progress Notes (Signed)
Video Interpreter # Normand Sloop [phone]

## 2020-01-28 NOTE — Progress Notes (Signed)
   PRENATAL VISIT NOTE  Subjective:  Kristina Gordon is a 39 y.o. G3P1011 at [redacted]w[redacted]d being seen today for ongoing prenatal care.  She is currently monitored for the following issues for this high-risk pregnancy and has Language barrier; Syncope; AMA (advanced maternal age) multigravida 35+; SS (short stature); S/P cesarean section; Supervision of high risk pregnancy, antepartum; History of HELLP syndrome, currently pregnant; Bicornuate uterus; and Marginal insertion of umbilical cord affecting management of mother on their problem list.  Patient reports no complaints.  Contractions: Irritability. Vag. Bleeding: None.  Movement: Present. Denies leaking of fluid.   The following portions of the patient's history were reviewed and updated as appropriate: allergies, current medications, past family history, past medical history, past social history, past surgical history and problem list.   Objective:   Vitals:   01/28/20 1541  BP: 118/77  Pulse: 83  Weight: 121 lb (54.9 kg)    Fetal Status: Fetal Heart Rate (bpm): 133   Movement: Present     General:  Alert, oriented and cooperative. Patient is in no acute distress.  Skin: Skin is warm and dry. No rash noted.   Cardiovascular: Normal heart rate noted  Respiratory: Normal respiratory effort, no problems with respiration noted  Abdomen: Soft, gravid, appropriate for gestational age.  Pain/Pressure: Present     Pelvic: Cervical exam deferred        Extremities: Normal range of motion.  Edema: Trace  Mental Status: Normal mood and affect. Normal behavior. Normal judgment and thought content.   Assessment and Plan:  Pregnancy: G3P1011 at [redacted]w[redacted]d 1. Supervision of high risk pregnancy, antepartum FHT and FH normal  2. Marginal insertion of umbilical cord affecting management of mother [place] for growth. Last growth 2 weeks ago showed EFW 11%.   3. Bicornuate uterus  4. History of HELLP syndrome, currently pregnant Not currently taking ASA  81mg .  Recommended starting.  5. S/P cesarean section Desires TOLAC  6. Language barrier Interpreter used.  Preterm labor symptoms and general obstetric precautions including but not limited to vaginal bleeding, contractions, leaking of fluid and fetal movement were reviewed in detail with the patient. Please refer to After Visit Summary for other counseling recommendations.   No follow-ups on file.  Future Appointments  Date Time Provider Department Center  02/06/2020  1:45 PM [facility] NURSE [facility]  02/06/2020  2:00 PM [facility]1 [facility]    [facility] DO

## 2020-02-01 ENCOUNTER — Encounter: Payer: Self-pay | Admitting: General Practice

## 2020-02-06 ENCOUNTER — Other Ambulatory Visit: Payer: Self-pay | Admitting: *Deleted

## 2020-02-06 ENCOUNTER — Other Ambulatory Visit: Payer: Self-pay

## 2020-02-06 ENCOUNTER — Ambulatory Visit: Payer: 59 | Admitting: *Deleted

## 2020-02-06 ENCOUNTER — Ambulatory Visit: Payer: 59 | Attending: Obstetrics and Gynecology

## 2020-02-06 DIAGNOSIS — Z362 Encounter for other antenatal screening follow-up: Secondary | ICD-10-CM | POA: Insufficient documentation

## 2020-02-06 DIAGNOSIS — O34219 Maternal care for unspecified type scar from previous cesarean delivery: Secondary | ICD-10-CM

## 2020-02-06 DIAGNOSIS — O43199 Other malformation of placenta, unspecified trimester: Secondary | ICD-10-CM | POA: Insufficient documentation

## 2020-02-06 DIAGNOSIS — O09299 Supervision of pregnancy with other poor reproductive or obstetric history, unspecified trimester: Secondary | ICD-10-CM | POA: Insufficient documentation

## 2020-02-06 DIAGNOSIS — O09529 Supervision of elderly multigravida, unspecified trimester: Secondary | ICD-10-CM

## 2020-02-06 DIAGNOSIS — O0990 Supervision of high risk pregnancy, unspecified, unspecified trimester: Secondary | ICD-10-CM

## 2020-02-06 DIAGNOSIS — O09522 Supervision of elderly multigravida, second trimester: Secondary | ICD-10-CM | POA: Diagnosis not present

## 2020-02-06 DIAGNOSIS — Z3A32 32 weeks gestation of pregnancy: Secondary | ICD-10-CM

## 2020-02-06 DIAGNOSIS — Q513 Bicornate uterus: Secondary | ICD-10-CM | POA: Diagnosis present

## 2020-02-06 DIAGNOSIS — O34593 Maternal care for other abnormalities of gravid uterus, third trimester: Secondary | ICD-10-CM

## 2020-02-14 ENCOUNTER — Ambulatory Visit (INDEPENDENT_AMBULATORY_CARE_PROVIDER_SITE_OTHER): Payer: 59 | Admitting: Family Medicine

## 2020-02-14 ENCOUNTER — Other Ambulatory Visit: Payer: Self-pay

## 2020-02-14 VITALS — BP 135/73 | HR 72 | Wt 121.3 lb

## 2020-02-14 DIAGNOSIS — Z789 Other specified health status: Secondary | ICD-10-CM

## 2020-02-14 DIAGNOSIS — O09299 Supervision of pregnancy with other poor reproductive or obstetric history, unspecified trimester: Secondary | ICD-10-CM

## 2020-02-14 DIAGNOSIS — O0990 Supervision of high risk pregnancy, unspecified, unspecified trimester: Secondary | ICD-10-CM

## 2020-02-14 DIAGNOSIS — Z98891 History of uterine scar from previous surgery: Secondary | ICD-10-CM

## 2020-02-14 DIAGNOSIS — O09523 Supervision of elderly multigravida, third trimester: Secondary | ICD-10-CM

## 2020-02-14 DIAGNOSIS — Q513 Bicornate uterus: Secondary | ICD-10-CM

## 2020-02-14 DIAGNOSIS — O43199 Other malformation of placenta, unspecified trimester: Secondary | ICD-10-CM

## 2020-02-14 NOTE — Progress Notes (Signed)
Pt states that the lo dose aspirin is causing heartburn & burning in her throat.

## 2020-02-14 NOTE — Progress Notes (Signed)
   PRENATAL VISIT NOTE  Subjective:  Kristina Gordon is a 39 y.o. G3P1011 at [redacted]w[redacted]d being seen today for ongoing prenatal care.  She is currently monitored for the following issues for this high-risk pregnancy and has Language barrier; Syncope; AMA (advanced maternal age) multigravida 35+; SS (short stature); S/P cesarean section; Supervision of high risk pregnancy, antepartum; History of HELLP syndrome, currently pregnant; Bicornuate uterus; and Marginal insertion of umbilical cord affecting management of mother on their problem list.  Patient reports heartburn.  Contractions: Not present. Vag. Bleeding: None.  Movement: Present. Denies leaking of fluid.   The following portions of the patient's history were reviewed and updated as appropriate: allergies, current medications, past family history, past medical history, past social history, past surgical history and problem list.   Objective:   Vitals:   02/14/20 0924  BP: 135/73  Pulse: 72  Weight: 121 lb 4.8 oz (55 kg)    Fetal Status: Fetal Heart Rate (bpm): 140   Movement: Present     General:  Alert, oriented and cooperative. Patient is in no acute distress.  Skin: Skin is warm and dry. No rash noted.   Cardiovascular: Normal heart rate noted  Respiratory: Normal respiratory effort, no problems with respiration noted  Abdomen: Soft, gravid, appropriate for gestational age.  Pain/Pressure: Present     Pelvic: Cervical exam deferred        Extremities: Normal range of motion.  Edema: Trace  Mental Status: Normal mood and affect. Normal behavior. Normal judgment and thought content.   Assessment and Plan:  Pregnancy: G3P1011 at [redacted]w[redacted]d 1. Supervision of high risk pregnancy, antepartum FHT and FH normal  2. Marginal insertion of umbilical cord affecting management of mother Good growth of the baby  3. Bicornuate uterus  4. History of HELLP syndrome, currently pregnant Taking ASA 81mg  - causing heartburn. WIll have patient  take in the morning just before eating.  5. S/P cesarean section Desires TOLAC  6. Multigravida of advanced maternal age in third trimester  7. Language barrier Interpreter used  Preterm labor symptoms and general obstetric precautions including but not limited to vaginal bleeding, contractions, leaking of fluid and fetal movement were reviewed in detail with the patient. Please refer to After Visit Summary for other counseling recommendations.   Return in about 2 weeks (around 02/28/2020) for HR OB f/u, In Office.  Future Appointments  Date Time Provider Department Center  03/05/2020  1:45 PM [facility] NURSE [facility]  03/05/2020  2:00 PM [facility]    [facility] DO

## 2020-02-28 ENCOUNTER — Encounter: Payer: Self-pay | Admitting: Obstetrics and Gynecology

## 2020-02-28 ENCOUNTER — Ambulatory Visit (INDEPENDENT_AMBULATORY_CARE_PROVIDER_SITE_OTHER): Payer: 59 | Admitting: Obstetrics and Gynecology

## 2020-02-28 VITALS — BP 133/87 | HR 79 | Wt 124.5 lb

## 2020-02-28 DIAGNOSIS — Q513 Bicornate uterus: Secondary | ICD-10-CM

## 2020-02-28 DIAGNOSIS — O0990 Supervision of high risk pregnancy, unspecified, unspecified trimester: Secondary | ICD-10-CM

## 2020-02-28 DIAGNOSIS — O43199 Other malformation of placenta, unspecified trimester: Secondary | ICD-10-CM

## 2020-02-28 DIAGNOSIS — Z98891 History of uterine scar from previous surgery: Secondary | ICD-10-CM

## 2020-02-28 DIAGNOSIS — O09299 Supervision of pregnancy with other poor reproductive or obstetric history, unspecified trimester: Secondary | ICD-10-CM

## 2020-02-28 NOTE — Patient Instructions (Signed)
Tercer trimestre de embarazo Third Trimester of Pregnancy  El tercer trimestre comprende desde la semana28 hasta la semana40 (desde el mes7 hasta el mes9). En este trimestre, el beb en gestacin (feto) crece muy rpidamente. Hacia el final del noveno mes, el beb en gestacin mide alrededor de 20pulgadas (45cm) de largo. Pesa entre 6y 10libras (2,70y 4,50kg). Siga estas indicaciones en su casa: Medicamentos  Tome los medicamentos de venta libre y los recetados solamente como se lo haya indicado el mdico. Algunos medicamentos son seguros para tomar durante el embarazo y otros no lo son.  Tome vitaminas prenatales que contengan por lo menos 600microgramos (?g) de cido flico.  Si tiene dificultad para mover el intestino (estreimiento), tome un medicamento para ablandar las heces (laxante) si su mdico se lo autoriza. Comida y bebida   Ingiera alimentos saludables de manera regular.  No coma carne cruda ni quesos sin cocinar.  Si obtiene poca cantidad de calcio de los alimentos que ingiere, consulte a su mdico sobre la posibilidad de tomar un suplemento diario de calcio.  La ingesta diaria de cuatro o cinco comidas pequeas en lugar de tres comidas abundantes.  Evite el consumo de alimentos ricos en grasas y azcares, como los alimentos fritos y los dulces.  Para evitar el estreimiento: ? Consuma alimentos ricos en fibra, como frutas y verduras frescas, cereales integrales y frijoles. ? Beba suficiente lquido para mantener el pis (orina) claro o de color amarillo plido. Actividad  Haga ejercicios solamente como se lo haya indicado el mdico. Interrumpa la actividad fsica si comienza a tener calambres.  No levante objetos pesados, use zapatos de tacones bajos y sintese derecha.  No haga ejercicio si hace demasiado calor, hay demasiada humedad o se encuentra en un lugar de mucha altura (altitud alta).  Puede continuar teniendo relaciones sexuales, a menos que el  mdico le indique lo contrario. Alivio del dolor y del malestar  Use un sostn que le brinde buen soporte si sus mamas estn sensibles.  Haga pausas frecuentes y descanse con las piernas levantadas si tiene calambres en las piernas o dolor en la zona lumbar.  Dese baos de asiento con agua tibia para aliviar el dolor o las molestias causadas por las hemorroides. Use una crema para las hemorroides si el mdico la autoriza.  Si desarrolla venas hinchadas y abultadas (vrices) en las piernas: ? Use medias de compresin o medias de descanso como se lo haya indicado el mdico. ? Levante (eleve) los pies durante 15minutos, 3 o 4veces por da. ? Limite el consumo de sal en sus alimentos. Seguridad  Colquese el cinturn de seguridad cuando conduzca.  Haga una lista de los nmeros de telfono de emergencia, que incluya los nmeros de telfono de familiares, amigos, el hospital, as como los departamentos de polica y bomberos. Preparacin para la llegada del beb Para prepararse para la llegada de su beb:  Tome clases prenatales.  Practique ir manejando al hospital.  Visite el hospital y recorra el rea de maternidad.  Hable en su trabajo acerca de tomar licencia cuando llegue el beb.  Prepare el bolso que llevar al hospital.  Prepare la habitacin del beb.  Concurra a los controles mdicos.  Compre un asiento de seguridad orientado hacia atrs para llevar al beb en el automvil. Aprenda cmo instalarlo en el auto. Instrucciones generales  No se d baos de inmersin en agua caliente, baos turcos ni saunas.  No consuma ningn producto que contenga nicotina o tabaco, como cigarrillos y cigarrillos   electrnicos. Si necesita ayuda para dejar de fumar, consulte al mdico.  No beba alcohol.  No se haga duchas vaginales ni use tampones o toallas higinicas perfumadas.  No mantenga las piernas cruzadas durante mucho tiempo.  No haga viajes de larga distancia, excepto si es  obligatorio. Hgalos solamente si su mdico la autoriza.  Visite a su dentista si no lo ha hecho durante el embarazo. Use un cepillo de cerdas suaves para cepillarse los dientes. Psese el hilo dental con suavidad.  Evite el contacto con las bandejas sanitarias de los gatos y la tierra que estos animales usan. Estos elementos contienen bacterias que pueden causar defectos congnitos al beb y la posible prdida del beb (aborto espontneo) o la muerte fetal.  Concurra a todas las visitas prenatales como se lo haya indicado el mdico. Esto es importante. Comunquese con un mdico si:  No est segura de si est en trabajo de parto o si ha roto la bolsa de las aguas.  Tiene mareos.  Tiene clicos leves o siente presin en la parte baja del vientre.  Sufre un dolor persistente en el abdomen.  Sigue teniendo malestar estomacal, vomita o tiene heces lquidas.  Advierte un lquido con olor ftido que proviene de la vagina.  Siente dolor al orinar. Solicite ayuda de inmediato si:  Tiene fiebre.  Tiene una prdida de lquido por la vagina.  Tiene sangrado o pequeas prdidas vaginales.  Siente dolor intenso o clicos en el abdomen.  Aumenta o baja de peso rpidamente.  Tiene dificultades para recuperar el aliento y siente dolor en el pecho.  Sbitamente se le hinchan mucho el rostro, las manos, los tobillos, los pies o las piernas.  No ha sentido los movimientos del beb durante una hora.  Siente un dolor de cabeza intenso que no se alivia con medicamentos.  Tiene dificultad para ver.  Tiene prdida de lquido o le sale un chorro de lquido de la vagina antes de estar en la semana 37.  Tiene espasmos abdominales (contracciones) regulares antes de estar en la semana 37. Resumen  El tercer trimestre comprende desde la semana28 hasta la semana40 (desde el mes7 hasta el mes9). Esta es la poca en que el beb en gestacin crece muy rpidamente.  Siga los consejos del mdico  con respecto a los medicamentos, la alimentacin y la actividad.  Preprese para la llegada del beb tomando las clases prenatales, preparando todo lo que necesitar el beb, arreglando la habitacin del beb y concurriendo a los controles mdicos.  Solicite ayuda de inmediato si tiene sangrado por la vagina, siente dolor en el pecho o tiene dificultad para respirar, o si no ha sentido que su beb se mueve en el transcurso de ms de una hora. Esta informacin no tiene como fin reemplazar el consejo del mdico. Asegrese de hacerle al mdico cualquier pregunta que tenga. Document Revised: 12/20/2016 Document Reviewed: 12/20/2016 Elsevier Patient Education  2020 Elsevier Inc.  

## 2020-02-28 NOTE — Progress Notes (Signed)
Subjective:  Kristina Gordon is a 39 y.o. G3P1011 at [redacted]w[redacted]d being seen today for ongoing prenatal care.  She is currently monitored for the following issues for this high-risk pregnancy and has Language barrier; Syncope; AMA (advanced maternal age) multigravida 35+; SS (short stature); S/P cesarean section; Supervision of high risk pregnancy, antepartum; History of HELLP syndrome, currently pregnant; Bicornuate uterus; and Marginal insertion of umbilical cord affecting management of mother on their problem list.  Patient reports general discomforts of pregnancy.  Contractions: Not present. Vag. Bleeding: None.  Movement: Present. Denies leaking of fluid.   The following portions of the patient's history were reviewed and updated as appropriate: allergies, current medications, past family history, past medical history, past social history, past surgical history and problem list. Problem list updated.  Objective:   Vitals:   02/28/20 1339 02/28/20 1340  BP: (!) 149/90 133/87  Pulse: 76 79  Weight: 124 lb 8 oz (56.5 kg)     Fetal Status: Fetal Heart Rate (bpm): 143   Movement: Present     General:  Alert, oriented and cooperative. Patient is in no acute distress.  Skin: Skin is warm and dry. No rash noted.   Cardiovascular: Normal heart rate noted  Respiratory: Normal respiratory effort, no problems with respiration noted  Abdomen: Soft, gravid, appropriate for gestational age. Pain/Pressure: Present     Pelvic:  Cervical exam deferred        Extremities: Normal range of motion.  Edema: Trace  Mental Status: Normal mood and affect. Normal behavior. Normal judgment and thought content.   Urinalysis:      Assessment and Plan:  Pregnancy: G3P1011 at [redacted]w[redacted]d  1. Supervision of high risk pregnancy, antepartum Stable Cultures next visit  2. Bicornuate uterus Growth next week  3. Marginal insertion of umbilical cord affecting management of mother Growth next week  4. History of  HELLP syndrome, currently pregnant Continue with BASA  5. S/P cesarean section For TOLAC, consented  Preterm labor symptoms and general obstetric precautions including but not limited to vaginal bleeding, contractions, leaking of fluid and fetal movement were reviewed in detail with the patient. Please refer to After Visit Summary for other counseling recommendations.  Return in about 1 week (around 03/06/2020) for OB visit, face to face, MD only.   Hermina Staggers, MD

## 2020-03-05 ENCOUNTER — Ambulatory Visit: Payer: 59 | Attending: Obstetrics and Gynecology

## 2020-03-05 ENCOUNTER — Other Ambulatory Visit: Payer: Self-pay

## 2020-03-05 ENCOUNTER — Ambulatory Visit: Payer: 59 | Admitting: *Deleted

## 2020-03-05 DIAGNOSIS — O09529 Supervision of elderly multigravida, unspecified trimester: Secondary | ICD-10-CM | POA: Diagnosis present

## 2020-03-05 DIAGNOSIS — Q513 Bicornate uterus: Secondary | ICD-10-CM | POA: Insufficient documentation

## 2020-03-05 DIAGNOSIS — O34593 Maternal care for other abnormalities of gravid uterus, third trimester: Secondary | ICD-10-CM

## 2020-03-05 DIAGNOSIS — O09299 Supervision of pregnancy with other poor reproductive or obstetric history, unspecified trimester: Secondary | ICD-10-CM | POA: Diagnosis present

## 2020-03-05 DIAGNOSIS — Z3A36 36 weeks gestation of pregnancy: Secondary | ICD-10-CM

## 2020-03-05 DIAGNOSIS — O0990 Supervision of high risk pregnancy, unspecified, unspecified trimester: Secondary | ICD-10-CM | POA: Insufficient documentation

## 2020-03-05 DIAGNOSIS — O09523 Supervision of elderly multigravida, third trimester: Secondary | ICD-10-CM | POA: Diagnosis not present

## 2020-03-05 DIAGNOSIS — O34219 Maternal care for unspecified type scar from previous cesarean delivery: Secondary | ICD-10-CM

## 2020-03-05 DIAGNOSIS — O43199 Other malformation of placenta, unspecified trimester: Secondary | ICD-10-CM

## 2020-03-06 ENCOUNTER — Ambulatory Visit (INDEPENDENT_AMBULATORY_CARE_PROVIDER_SITE_OTHER): Payer: 59 | Admitting: Obstetrics and Gynecology

## 2020-03-06 ENCOUNTER — Encounter: Payer: Self-pay | Admitting: Obstetrics and Gynecology

## 2020-03-06 ENCOUNTER — Encounter: Payer: Self-pay | Admitting: Family Medicine

## 2020-03-06 ENCOUNTER — Other Ambulatory Visit (HOSPITAL_COMMUNITY)
Admission: RE | Admit: 2020-03-06 | Discharge: 2020-03-06 | Disposition: A | Discharge status: Home / Self Care | Payer: 59 | Source: Ambulatory Visit | Attending: Obstetrics and Gynecology | Admitting: Obstetrics and Gynecology

## 2020-03-06 VITALS — BP 136/91 | HR 68 | Wt 126.5 lb

## 2020-03-06 DIAGNOSIS — Q513 Bicornate uterus: Secondary | ICD-10-CM

## 2020-03-06 DIAGNOSIS — Z789 Other specified health status: Secondary | ICD-10-CM

## 2020-03-06 DIAGNOSIS — O0990 Supervision of high risk pregnancy, unspecified, unspecified trimester: Secondary | ICD-10-CM | POA: Diagnosis not present

## 2020-03-06 DIAGNOSIS — O09523 Supervision of elderly multigravida, third trimester: Secondary | ICD-10-CM

## 2020-03-06 DIAGNOSIS — O43199 Other malformation of placenta, unspecified trimester: Secondary | ICD-10-CM

## 2020-03-06 NOTE — Patient Instructions (Signed)
Tercer trimestre de embarazo Third Trimester of Pregnancy  El tercer trimestre comprende desde la semana28 hasta la semana40 (desde el mes7 hasta el mes9). En este trimestre, el beb en gestacin (feto) crece muy rpidamente. Hacia el final del noveno mes, el beb en gestacin mide alrededor de 20pulgadas (45cm) de largo. Pesa entre 6y 10libras (2,70y 4,50kg). Siga estas indicaciones en su casa: Medicamentos  Tome los medicamentos de venta libre y los recetados solamente como se lo haya indicado el mdico. Algunos medicamentos son seguros para tomar durante el embarazo y otros no lo son.  Tome vitaminas prenatales que contengan por lo menos 600microgramos (?g) de cido flico.  Si tiene dificultad para mover el intestino (estreimiento), tome un medicamento para ablandar las heces (laxante) si su mdico se lo autoriza. Comida y bebida   Ingiera alimentos saludables de manera regular.  No coma carne cruda ni quesos sin cocinar.  Si obtiene poca cantidad de calcio de los alimentos que ingiere, consulte a su mdico sobre la posibilidad de tomar un suplemento diario de calcio.  La ingesta diaria de cuatro o cinco comidas pequeas en lugar de tres comidas abundantes.  Evite el consumo de alimentos ricos en grasas y azcares, como los alimentos fritos y los dulces.  Para evitar el estreimiento: ? Consuma alimentos ricos en fibra, como frutas y verduras frescas, cereales integrales y frijoles. ? Beba suficiente lquido para mantener el pis (orina) claro o de color amarillo plido. Actividad  Haga ejercicios solamente como se lo haya indicado el mdico. Interrumpa la actividad fsica si comienza a tener calambres.  No levante objetos pesados, use zapatos de tacones bajos y sintese derecha.  No haga ejercicio si hace demasiado calor, hay demasiada humedad o se encuentra en un lugar de mucha altura (altitud alta).  Puede continuar teniendo relaciones sexuales, a menos que el  mdico le indique lo contrario. Alivio del dolor y del malestar  Use un sostn que le brinde buen soporte si sus mamas estn sensibles.  Haga pausas frecuentes y descanse con las piernas levantadas si tiene calambres en las piernas o dolor en la zona lumbar.  Dese baos de asiento con agua tibia para aliviar el dolor o las molestias causadas por las hemorroides. Use una crema para las hemorroides si el mdico la autoriza.  Si desarrolla venas hinchadas y abultadas (vrices) en las piernas: ? Use medias de compresin o medias de descanso como se lo haya indicado el mdico. ? Levante (eleve) los pies durante 15minutos, 3 o 4veces por da. ? Limite el consumo de sal en sus alimentos. Seguridad  Colquese el cinturn de seguridad cuando conduzca.  Haga una lista de los nmeros de telfono de emergencia, que incluya los nmeros de telfono de familiares, amigos, el hospital, as como los departamentos de polica y bomberos. Preparacin para la llegada del beb Para prepararse para la llegada de su beb:  Tome clases prenatales.  Practique ir manejando al hospital.  Visite el hospital y recorra el rea de maternidad.  Hable en su trabajo acerca de tomar licencia cuando llegue el beb.  Prepare el bolso que llevar al hospital.  Prepare la habitacin del beb.  Concurra a los controles mdicos.  Compre un asiento de seguridad orientado hacia atrs para llevar al beb en el automvil. Aprenda cmo instalarlo en el auto. Instrucciones generales  No se d baos de inmersin en agua caliente, baos turcos ni saunas.  No consuma ningn producto que contenga nicotina o tabaco, como cigarrillos y cigarrillos   electrnicos. Si necesita ayuda para dejar de fumar, consulte al mdico.  No beba alcohol.  No se haga duchas vaginales ni use tampones o toallas higinicas perfumadas.  No mantenga las piernas cruzadas durante mucho tiempo.  No haga viajes de larga distancia, excepto si es  obligatorio. Hgalos solamente si su mdico la autoriza.  Visite a su dentista si no lo ha hecho durante el embarazo. Use un cepillo de cerdas suaves para cepillarse los dientes. Psese el hilo dental con suavidad.  Evite el contacto con las bandejas sanitarias de los gatos y la tierra que estos animales usan. Estos elementos contienen bacterias que pueden causar defectos congnitos al beb y la posible prdida del beb (aborto espontneo) o la muerte fetal.  Concurra a todas las visitas prenatales como se lo haya indicado el mdico. Esto es importante. Comunquese con un mdico si:  No est segura de si est en trabajo de parto o si ha roto la bolsa de las aguas.  Tiene mareos.  Tiene clicos leves o siente presin en la parte baja del vientre.  Sufre un dolor persistente en el abdomen.  Sigue teniendo malestar estomacal, vomita o tiene heces lquidas.  Advierte un lquido con olor ftido que proviene de la vagina.  Siente dolor al orinar. Solicite ayuda de inmediato si:  Tiene fiebre.  Tiene una prdida de lquido por la vagina.  Tiene sangrado o pequeas prdidas vaginales.  Siente dolor intenso o clicos en el abdomen.  Aumenta o baja de peso rpidamente.  Tiene dificultades para recuperar el aliento y siente dolor en el pecho.  Sbitamente se le hinchan mucho el rostro, las manos, los tobillos, los pies o las piernas.  No ha sentido los movimientos del beb durante una hora.  Siente un dolor de cabeza intenso que no se alivia con medicamentos.  Tiene dificultad para ver.  Tiene prdida de lquido o le sale un chorro de lquido de la vagina antes de estar en la semana 37.  Tiene espasmos abdominales (contracciones) regulares antes de estar en la semana 37. Resumen  El tercer trimestre comprende desde la semana28 hasta la semana40 (desde el mes7 hasta el mes9). Esta es la poca en que el beb en gestacin crece muy rpidamente.  Siga los consejos del mdico  con respecto a los medicamentos, la alimentacin y la actividad.  Preprese para la llegada del beb tomando las clases prenatales, preparando todo lo que necesitar el beb, arreglando la habitacin del beb y concurriendo a los controles mdicos.  Solicite ayuda de inmediato si tiene sangrado por la vagina, siente dolor en el pecho o tiene dificultad para respirar, o si no ha sentido que su beb se mueve en el transcurso de ms de una hora. Esta informacin no tiene como fin reemplazar el consejo del mdico. Asegrese de hacerle al mdico cualquier pregunta que tenga. Document Revised: 12/20/2016 Document Reviewed: 12/20/2016 Elsevier Patient Education  2020 Elsevier Inc.  

## 2020-03-06 NOTE — Progress Notes (Signed)
Subjective:  Kristina Gordon is a 39 y.o. G3P1011 at 28w5dbeing seen today for ongoing prenatal care.  She is currently monitored for the following issues for this high-risk pregnancy and has Language barrier; Syncope; AMA (advanced maternal age) multigravida 35+; SS (short stature); S/P cesarean section; Supervision of high risk pregnancy, antepartum; History of HELLP syndrome, currently pregnant; Bicornuate uterus; and Marginal insertion of umbilical cord affecting management of mother on their problem list.  Patient reports general discomforts of pregnancy.  Contractions: Not present. Vag. Bleeding: None.  Movement: Present. Denies leaking of fluid.   The following portions of the patient's history were reviewed and updated as appropriate: allergies, current medications, past family history, past medical history, past social history, past surgical history and problem list. Problem list updated.  Objective:   Vitals:   03/06/20 1315 03/06/20 1317  BP: 135/90 (!) 136/91  Pulse: 67 68  Weight: 126 lb 8 oz (57.4 kg)     Fetal Status: Fetal Heart Rate (bpm): 134   Movement: Present     General:  Alert, oriented and cooperative. Patient is in no acute distress.  Skin: Skin is warm and dry. No rash noted.   Cardiovascular: Normal heart rate noted  Respiratory: Normal respiratory effort, no problems with respiration noted  Abdomen: Soft, gravid, appropriate for gestational age. Pain/Pressure: Present     Pelvic:  Cervical exam performed        Extremities: Normal range of motion.  Edema: Trace  Mental Status: Normal mood and affect. Normal behavior. Normal judgment and thought content.   Urinalysis:      Assessment and Plan:  Pregnancy: G3P1011 at 347w5d1. Supervision of high risk pregnancy, antepartum Stable Labor precautions - GC/Chlamydia probe amp [facility], beta strep (group b only) - Comp Met (CMET) - CBC - Protein / creatinine ratio,  urine  2. Marginal insertion of umbilical cord affecting management of mother Nl growth  3. Multigravida of advanced maternal age in third trimester  4. Bicornuate uterus   5. Language barrier Live interrupter used during today's visit  6. H/O c section  TOLAC, consented  Term labor symptoms and general obstetric precautions including but not limited to vaginal bleeding, contractions, leaking of fluid and fetal movement were reviewed in detail with the patient. Please refer to After Visit Summary for other counseling recommendations.  Return in about 1 week (around 03/13/2020) for OB visit, face to face, MD only.   ErChancy MilroyMD

## 2020-03-07 LAB — CBC
Hematocrit: 36.3 % (ref 34.0–46.6)
Hemoglobin: 12.1 g/dL (ref 11.1–15.9)
MCH: 31.1 pg (ref 26.6–33.0)
MCHC: 33.3 g/dL (ref 31.5–35.7)
MCV: 93 fL (ref 79–97)
Platelets: 188 10*3/uL (ref 150–450)
RBC: 3.89 x10E6/uL (ref 3.77–5.28)
RDW: 13.1 % (ref 11.7–15.4)
WBC: 7 10*3/uL (ref 3.4–10.8)

## 2020-03-07 LAB — COMPREHENSIVE METABOLIC PANEL
ALT: 12 IU/L (ref 0–32)
AST: 20 IU/L (ref 0–40)
Albumin/Globulin Ratio: 1.4 (ref 1.2–2.2)
Albumin: 3.7 g/dL — ABNORMAL LOW (ref 3.8–4.8)
Alkaline Phosphatase: 94 IU/L (ref 44–121)
BUN/Creatinine Ratio: 14 (ref 9–23)
BUN: 11 mg/dL (ref 6–24)
Bilirubin Total: <0.2 mg/dL (ref 0.0–1.2)
CO2: 22 mmol/L (ref 20–29)
Calcium: 8.9 mg/dL (ref 8.7–10.2)
Chloride: 100 mmol/L (ref 96–106)
Creatinine, Ser: 0.78 mg/dL (ref 0.57–1.00)
GFR calc Af Amer: 110 mL/min/{1.73_m2} (ref >59)
GFR calc non Af Amer: 95 mL/min/{1.73_m2} (ref >59)
Globulin, Total: 2.6 g/dL (ref 1.5–4.5)
Glucose: 80 mg/dL (ref 65–99)
Potassium: 4.7 mmol/L (ref 3.5–5.2)
Sodium: 134 mmol/L (ref 134–144)
Total Protein: 6.3 g/dL (ref 6.0–8.5)

## 2020-03-07 LAB — GC/CHLAMYDIA PROBE AMP (~~LOC~~) NOT AT ARMC
Chlamydia: NEGATIVE
Comment: NEGATIVE
Comment: NORMAL
Neisseria Gonorrhea: NEGATIVE

## 2020-03-10 LAB — CULTURE, BETA STREP (GROUP B ONLY): Strep Gp B Culture: NEGATIVE

## 2020-03-11 ENCOUNTER — Other Ambulatory Visit: Payer: Self-pay

## 2020-03-11 ENCOUNTER — Encounter (HOSPITAL_COMMUNITY): Payer: Self-pay | Admitting: Family Medicine

## 2020-03-11 ENCOUNTER — Ambulatory Visit (INDEPENDENT_AMBULATORY_CARE_PROVIDER_SITE_OTHER): Payer: 59

## 2020-03-11 ENCOUNTER — Inpatient Hospital Stay (HOSPITAL_COMMUNITY)
Admission: AD | Admit: 2020-03-11 | Discharge: 2020-03-14 | DRG: 807 | Disposition: A | Discharge status: Home / Self Care | Payer: 59 | Attending: Family Medicine | Admitting: Family Medicine

## 2020-03-11 VITALS — BP 158/88 | HR 70 | Wt 124.0 lb

## 2020-03-11 DIAGNOSIS — Z603 Acculturation difficulty: Secondary | ICD-10-CM

## 2020-03-11 DIAGNOSIS — Z98891 History of uterine scar from previous surgery: Secondary | ICD-10-CM

## 2020-03-11 DIAGNOSIS — Z0130 Encounter for examination of blood pressure without abnormal findings: Secondary | ICD-10-CM | POA: Diagnosis not present

## 2020-03-11 DIAGNOSIS — Q513 Bicornate uterus: Secondary | ICD-10-CM | POA: Diagnosis not present

## 2020-03-11 DIAGNOSIS — O139 Gestational [pregnancy-induced] hypertension without significant proteinuria, unspecified trimester: Secondary | ICD-10-CM

## 2020-03-11 DIAGNOSIS — O133 Gestational [pregnancy-induced] hypertension without significant proteinuria, third trimester: Secondary | ICD-10-CM

## 2020-03-11 DIAGNOSIS — O3403 Maternal care for unspecified congenital malformation of uterus, third trimester: Secondary | ICD-10-CM | POA: Diagnosis present

## 2020-03-11 DIAGNOSIS — O0990 Supervision of high risk pregnancy, unspecified, unspecified trimester: Secondary | ICD-10-CM

## 2020-03-11 DIAGNOSIS — O34219 Maternal care for unspecified type scar from previous cesarean delivery: Secondary | ICD-10-CM | POA: Diagnosis present

## 2020-03-11 DIAGNOSIS — O09529 Supervision of elderly multigravida, unspecified trimester: Secondary | ICD-10-CM

## 2020-03-11 DIAGNOSIS — O43123 Velamentous insertion of umbilical cord, third trimester: Secondary | ICD-10-CM | POA: Diagnosis present

## 2020-03-11 DIAGNOSIS — O43199 Other malformation of placenta, unspecified trimester: Secondary | ICD-10-CM | POA: Diagnosis present

## 2020-03-11 DIAGNOSIS — Z20822 Contact with and (suspected) exposure to covid-19: Secondary | ICD-10-CM | POA: Diagnosis present

## 2020-03-11 DIAGNOSIS — Z789 Other specified health status: Secondary | ICD-10-CM | POA: Diagnosis present

## 2020-03-11 DIAGNOSIS — O134 Gestational [pregnancy-induced] hypertension without significant proteinuria, complicating childbirth: Secondary | ICD-10-CM | POA: Diagnosis present

## 2020-03-11 DIAGNOSIS — Z3A37 37 weeks gestation of pregnancy: Secondary | ICD-10-CM

## 2020-03-11 DIAGNOSIS — O09299 Supervision of pregnancy with other poor reproductive or obstetric history, unspecified trimester: Secondary | ICD-10-CM

## 2020-03-11 DIAGNOSIS — Z3490 Encounter for supervision of normal pregnancy, unspecified, unspecified trimester: Secondary | ICD-10-CM | POA: Diagnosis present

## 2020-03-11 LAB — COMPREHENSIVE METABOLIC PANEL
ALT: 17 U/L (ref 0–44)
AST: 25 U/L (ref 15–41)
Albumin: 3 g/dL — ABNORMAL LOW (ref 3.5–5.0)
Alkaline Phosphatase: 90 U/L (ref 38–126)
Anion gap: 10 (ref 5–15)
BUN: 11 mg/dL (ref 6–20)
CO2: 20 mmol/L — ABNORMAL LOW (ref 22–32)
Calcium: 8.8 mg/dL — ABNORMAL LOW (ref 8.9–10.3)
Chloride: 103 mmol/L (ref 98–111)
Creatinine, Ser: 0.62 mg/dL (ref 0.44–1.00)
GFR, Estimated: >60 mL/min (ref >60)
Glucose, Bld: 73 mg/dL (ref 70–99)
Potassium: 3.8 mmol/L (ref 3.5–5.1)
Sodium: 133 mmol/L — ABNORMAL LOW (ref 135–145)
Total Bilirubin: 0.4 mg/dL (ref 0.3–1.2)
Total Protein: 6.5 g/dL (ref 6.5–8.1)

## 2020-03-11 LAB — CBC
HCT: 37.1 % (ref 36.0–46.0)
Hemoglobin: 12.7 g/dL (ref 12.0–15.0)
MCH: 32.2 pg (ref 26.0–34.0)
MCHC: 34.2 g/dL (ref 30.0–36.0)
MCV: 93.9 fL (ref 80.0–100.0)
Platelets: 203 10*3/uL (ref 150–400)
RBC: 3.95 MIL/uL (ref 3.87–5.11)
RDW: 13.7 % (ref 11.5–15.5)
WBC: 7.4 10*3/uL (ref 4.0–10.5)
nRBC: 0 % (ref 0.0–0.2)

## 2020-03-11 LAB — PROTEIN / CREATININE RATIO, URINE
Creatinine, Urine: 79.42 mg/dL
Protein Creatinine Ratio: 0.13 mg/mg{Cre} (ref 0.00–0.15)
Total Protein, Urine: 10 mg/dL

## 2020-03-11 LAB — TYPE AND SCREEN
ABO/RH(D): A POS
Antibody Screen: NEGATIVE

## 2020-03-11 LAB — RESPIRATORY PANEL BY RT PCR (FLU A&B, COVID)
Influenza A by PCR: NEGATIVE
Influenza B by PCR: NEGATIVE
SARS Coronavirus 2 by RT PCR: NEGATIVE

## 2020-03-11 MED ORDER — FENTANYL CITRATE (PF) 100 MCG/2ML IJ SOLN: 50.0000 ug | INTRAMUSCULAR | Status: DC | PRN

## 2020-03-11 MED ORDER — TERBUTALINE SULFATE 1 MG/ML IJ SOLN: 0.2500 mg | Freq: Once | INTRAMUSCULAR | Status: DC | PRN

## 2020-03-11 MED ORDER — LIDOCAINE HCL (PF) 1 % IJ SOLN: 30.0000 mL | INTRAMUSCULAR | Status: DC | PRN

## 2020-03-11 MED ORDER — ONDANSETRON HCL 4 MG/2ML IJ SOLN
4.0000 mg | Freq: Four times a day (QID) | INTRAMUSCULAR | Status: DC | PRN
  Administered 2020-03-12: 4 mg via INTRAVENOUS
  Filled 2020-03-11: qty 2

## 2020-03-11 MED ORDER — OXYTOCIN-SODIUM CHLORIDE 30-0.9 UT/500ML-% IV SOLN
1.0000 m[IU]/min | INTRAVENOUS | Status: DC
  Administered 2020-03-11: 2 m[IU]/min via INTRAVENOUS
  Administered 2020-03-12: 6 m[IU]/min via INTRAVENOUS
  Filled 2020-03-11: qty 500

## 2020-03-11 MED ORDER — OXYTOCIN-SODIUM CHLORIDE 30-0.9 UT/500ML-% IV SOLN
2.5000 [IU]/h | INTRAVENOUS | Status: DC
  Administered 2020-03-13: 2.5 [IU]/h via INTRAVENOUS

## 2020-03-11 MED ORDER — LACTATED RINGERS IV SOLN: 500.0000 mL | INTRAVENOUS | Status: DC | PRN

## 2020-03-11 MED ORDER — ACETAMINOPHEN 325 MG PO TABS: 650.0000 mg | ORAL_TABLET | ORAL | Status: DC | PRN

## 2020-03-11 MED ORDER — OXYCODONE-ACETAMINOPHEN 5-325 MG PO TABS: 1.0000 | ORAL_TABLET | ORAL | Status: DC | PRN

## 2020-03-11 MED ORDER — LACTATED RINGERS IV SOLN: INTRAVENOUS | Status: DC

## 2020-03-11 MED ORDER — SOD CITRATE-CITRIC ACID 500-334 MG/5ML PO SOLN: 30.0000 mL | ORAL | Status: DC | PRN

## 2020-03-11 MED ORDER — OXYCODONE-ACETAMINOPHEN 5-325 MG PO TABS: 2.0000 | ORAL_TABLET | ORAL | Status: DC | PRN

## 2020-03-11 MED ORDER — OXYTOCIN BOLUS FROM INFUSION
333.0000 mL | Freq: Once | INTRAVENOUS | Status: AC
  Administered 2020-03-13: 333 mL via INTRAVENOUS

## 2020-03-11 NOTE — Progress Notes (Signed)
Chart reviewed for nurse visit. Nurse consulted with Dr. Adrian Blackwater who recommended patient to present to hospital for IOL.    Judeth Horn, NP 03/11/2020 4:45 PM

## 2020-03-11 NOTE — Progress Notes (Signed)
Pt here today for BP check.  With Spanish Interpreter Burnard Hawthorne.,  pt reports that the provider informed her that if her BP's are over 140/90 to please give the office a call.  Pt reports that her BP's are the following 141/96, 142/95 yesterday and today's BP 139/94.  Pt denies headache or visual disturbances.  BP LA 157/80.  Rpt BP RA 158/88.  Notified Dr. Alysia Penna who recommends that pt be induced and to notify Stinson, DO.  Notified Stinson, DO who recommends that patient arrives in one hour to be induced d/t her elevated BP.  Pt verbalized understanding with no further questions.   Addison Naegeli, RN  03/11/20

## 2020-03-11 NOTE — H&P (Signed)
OBSTETRIC ADMISSION HISTORY AND PHYSICAL  Kristina Gordon is a 39 y.o. female G63P1011 with IUP at [redacted]w[redacted]d by LMP presenting for IOL-gHTN, desires TOLAC. She reports +FMs, No LOF, no VB, no blurry vision, headaches or peripheral edema, and RUQ pain.  She plans on breast feeding. She request POPs for birth control. She received her prenatal care at [facility]   Dating: By LMP --->  Estimated Date of Delivery: 03/29/20  Sono:    03/05/20 @[redacted]w[redacted]d , CWD, normal anatomy, cephalic presentation, 2522g, EFW   Prenatal History/Complications:  TOLAC (prior cesarean section G2 2/2 preE w/ SF and HELLP) AMA Language barrier (Spanish speaking)  Past Medical History: Past Medical History:  Diagnosis Date  . Anemia   . Complication of anesthesia    pt. states it took her " time " to wake up   . HELLP (hemolytic anemia/elev liver enzymes/low platelets in pregnancy)   . Infection    UTI  . Miscarriage   . Ovarian cyst   . Pregnancy induced hypertension     Past Surgical History: Past Surgical History:  Procedure Laterality Date  . CESAREAN SECTION N/A 06/10/2015   Procedure: CESAREAN SECTION;  Surgeon: 08/08/2015, MD;  Location: [facility];  Service: Obstetrics;  Laterality: N/A;    Obstetrical History: OB History    Gravida  3   Para  1   Term  1   Preterm      AB  1   Living  1     SAB  0   TAB  0   Ectopic  1   Multiple  0   Live Births  1           Social History Social History   Socioeconomic History  . Marital status: Married    Spouse name: Not on file  . Number of children: Not on file  . Years of education: Not on file  . Highest education level: Not on file  Occupational History  . Not on file  Tobacco Use  . Smoking status: Never Smoker  . Smokeless tobacco: Never Used  Vaping Use  . Vaping Use: Never used  Substance and Sexual Activity  . Alcohol use: No  . Drug use: No  . Sexual activity: Yes    Birth control/protection: None  Other  Topics Concern  . Not on file  Social History Narrative  . Not on file   Social Determinants of Health   Financial Resource Strain:   . Difficulty of Paying Living Expenses: Not on file  Food Insecurity: No Food Insecurity  . Worried About Jaymes Graff in the Last Year: Never true  . Ran Out of Food in the Last Year: Never true  Transportation Needs: No Transportation Needs  . Lack of Transportation (Medical): No  . Lack of Transportation (Non-Medical): No  Physical Activity:   . Days of Exercise per Week: Not on file  . Minutes of Exercise per Session: Not on file  Stress:   . Feeling of Stress : Not on file  Social Connections:   . Frequency of Communication with Friends and Family: Not on file  . Frequency of Social Gatherings with Friends and Family: Not on file  . Attends Religious Services: Not on file  . Active Member of Clubs or Organizations: Not on file  . Attends Programme researcher, broadcasting/film/video Meetings: Not on file  . Marital Status: Not on file    Family History: Family History  Problem Relation Age  of Onset  . Hypertension Maternal Grandfather   . Cancer Maternal Grandfather        leukemia  . Varicose Veins Mother   . Hyperlipidemia Father     Allergies: No Known Allergies  Medications Prior to Admission  Medication Sig Dispense Refill Last Dose  . aspirin EC 81 MG tablet Take 1 tablet (81 mg total) by mouth daily. Take after 12 weeks for prevention of preeclampsia later in pregnancy 300 tablet 2   . Prenatal Multivit-Min-Fe-FA (PRENATAL/IRON) TABS Take 1 tablet by mouth daily.         Review of Systems   All systems reviewed and negative except as stated in HPI  Blood pressure (!) 153/109, pulse 91, temperature 98 F (36.7 C), temperature source Oral, height 4\' 8"  (1.422 m), weight 55.9 kg, last menstrual period 06/23/2019, unknown if currently breastfeeding. General appearance: alert, cooperative and no distress Lungs: normal respiratory  effort Heart: regular rate and rhythm Abdomen: soft, non-tender; gravid Pelvic: as noted below Extremities: Homans sign is negative, no sign of DVT Presentation: cephalic by BSUS Fetal monitoringBaseline: 130 bpm, Variability: Good {> 6 bpm), Accelerations: Reactive and Decelerations: Absent Uterine activityFrequency: Every 1-5 minutes Dilation: Closed Effacement (%): Thick Station: Ballotable Exam by:: [email or web address] labs: ABO, Rh: --/--/PENDING (10/12 1639) Antibody: PENDING (10/12 1639) Rubella: 11.30 (07/14 1513) RPR: Non Reactive (08/12 0835)  HBsAg: Negative (07/14 1513)  HIV: Non Reactive (08/12 0835)  GBS: Negative/-- (10/07 1331)  2 hr Glucola passed Genetic screening  Low risk Anatomy 02-21-1998 marginal cord insertion, otherwise normal  Prenatal Transfer Tool  Maternal Diabetes: No Genetic Screening: Normal Maternal Ultrasounds/Referrals: Normal Fetal Ultrasounds or other Referrals:  None Maternal Substance Abuse:  No Significant Maternal Medications:  None Significant Maternal Lab Results: None  Results for orders placed or performed during the hospital encounter of 03/11/20 (from the past 24 hour(s))  Type and screen [facility]   Collection Time: 03/11/20  4:39 PM  Result Value Ref Range   ABO/RH(D) PENDING    Antibody Screen PENDING    Sample Expiration      03/14/2020,2359 Performed at [facility], [address], [place], [place]     Patient Active Problem List   Diagnosis Date Noted  . 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 Marginal insertion of umbilical cord affecting management of mother 01/10/2020  . Supervision of high risk pregnancy, antepartum 12/12/2019  . History of HELLP syndrome, currently pregnant 12/12/2019  . S/P cesarean section 06/13/2015  . Language barrier 03/10/2015  . Syncope 03/10/2015  . AMA (advanced maternal age) multigravida 35+ 03/10/2015  . SS (short stature) 03/10/2015     Assessment/Plan:  [name] is a 39 y.o. G3P1011 at [redacted]w[redacted]d here for IOL-GHTN, sent over from clinic 03/11/20.  #IOL: Patient counseled extensively on risks/benefits of TOLAC vs repeat cesarean delivery. Patient would like to proceed with IOL. Will start low dose pitocin at this time and titrate up to 6cc/hr. Will place FB when able. #Pain: PRN #FWB:  cat 1 #ID: GBS neg #MOF: both #MOC: POPs #Circ: n/a #gHTN: currently asymptomatic. PreE labs pending. BP on admit 153/109.  05/11/20, MD  03/11/2020, 5:06 PM

## 2020-03-12 ENCOUNTER — Inpatient Hospital Stay (HOSPITAL_COMMUNITY): Payer: 59 | Admitting: Anesthesiology

## 2020-03-12 LAB — CBC
HCT: 37.3 % (ref 36.0–46.0)
Hemoglobin: 12.9 g/dL (ref 12.0–15.0)
MCH: 31.6 pg (ref 26.0–34.0)
MCHC: 34.6 g/dL (ref 30.0–36.0)
MCV: 91.4 fL (ref 80.0–100.0)
Platelets: 189 10*3/uL (ref 150–400)
RBC: 4.08 MIL/uL (ref 3.87–5.11)
RDW: 13.4 % (ref 11.5–15.5)
WBC: 9.2 10*3/uL (ref 4.0–10.5)
nRBC: 0 % (ref 0.0–0.2)

## 2020-03-12 LAB — GLUCOSE, CAPILLARY: Glucose-Capillary: 77 mg/dL (ref 70–99)

## 2020-03-12 LAB — RPR: RPR Ser Ql: NONREACTIVE

## 2020-03-12 MED ORDER — PHENYLEPHRINE 40 MCG/ML (10ML) SYRINGE FOR IV PUSH (FOR BLOOD PRESSURE SUPPORT): 80.0000 ug | PREFILLED_SYRINGE | INTRAVENOUS | Status: DC | PRN

## 2020-03-12 MED ORDER — HYDRALAZINE HCL 20 MG/ML IJ SOLN: 10.0000 mg | INTRAMUSCULAR | Status: DC | PRN

## 2020-03-12 MED ORDER — FENTANYL-BUPIVACAINE-NACL 0.5-0.125-0.9 MG/250ML-% EP SOLN
12.0000 mL/h | EPIDURAL | Status: DC | PRN
  Filled 2020-03-12: qty 250

## 2020-03-12 MED ORDER — LACTATED RINGERS IV SOLN: 500.0000 mL | Freq: Once | INTRAVENOUS | Status: DC

## 2020-03-12 MED ORDER — DIPHENHYDRAMINE HCL 50 MG/ML IJ SOLN: 12.5000 mg | INTRAMUSCULAR | Status: DC | PRN

## 2020-03-12 MED ORDER — LABETALOL HCL 5 MG/ML IV SOLN: 40.0000 mg | INTRAVENOUS | Status: DC | PRN

## 2020-03-12 MED ORDER — LIDOCAINE HCL (PF) 1 % IJ SOLN
INTRAMUSCULAR | Status: DC | PRN
  Administered 2020-03-12 (×2): 3 mL via EPIDURAL

## 2020-03-12 MED ORDER — LABETALOL HCL 5 MG/ML IV SOLN: 20.0000 mg | INTRAVENOUS | Status: DC | PRN

## 2020-03-12 MED ORDER — FENTANYL-BUPIVACAINE-NACL 0.5-0.125-0.9 MG/250ML-% EP SOLN: 12.0000 mL/h | EPIDURAL | Status: DC | PRN

## 2020-03-12 MED ORDER — LACTATED RINGERS IV SOLN
500.0000 mL | Freq: Once | INTRAVENOUS | Status: AC
  Administered 2020-03-12: 500 mL via INTRAVENOUS

## 2020-03-12 MED ORDER — BUPIVACAINE HCL (PF) 0.75 % IJ SOLN
INTRAMUSCULAR | Status: DC | PRN
Start: 2020-03-12 — End: 2020-03-13
  Administered 2020-03-12: 9.5 mL/h via EPIDURAL

## 2020-03-12 MED ORDER — EPHEDRINE 5 MG/ML INJ: 10.0000 mg | INTRAVENOUS | Status: DC | PRN

## 2020-03-12 MED ORDER — PHENYLEPHRINE 40 MCG/ML (10ML) SYRINGE FOR IV PUSH (FOR BLOOD PRESSURE SUPPORT)
80.0000 ug | PREFILLED_SYRINGE | INTRAVENOUS | Status: DC | PRN
  Filled 2020-03-12: qty 10

## 2020-03-12 MED ORDER — LABETALOL HCL 5 MG/ML IV SOLN: 80.0000 mg | INTRAVENOUS | Status: DC | PRN

## 2020-03-12 NOTE — Progress Notes (Signed)
Labor Progress Note Kristina Gordon is a 39 y.o. G3P1011 at [redacted]w[redacted]d presented for IOL 2/2 gHTN.  S: Pt reports increasing discomfort with contractions. No concerns at this time.  O:  BP (!) 144/81   Pulse 65   Temp 97.9 F (36.6 C) (Oral)   Resp 16   Ht 4\' 8"  (1.422 m)   Wt 55.9 kg   LMP 06/23/2019 (Exact Date)   BMI 27.64 kg/m  EFM: baseline 130/moderate variability/+accels/no decels  CVE: Dilation: Closed Effacement (%): Thick Station: Ballotable Presentation: Vertex Exam by:: Dicy Smigel   A&P: 39 y.o. 41 [redacted]w[redacted]d presented for IOL 2/2 gHTN. #Labor: Started on pitocin at 109. S/p FB placement with speculum at 2140. Still in place. Will plan to recheck in 4 hours or sooner as clinically indicated. #Pain: TBD #FWB: Category 1 strip #GBS negative #gHTN: blood pressures normal to mild range. Preeclampsia labs unremarkable on admission (UP:C 0.13). #TOLAC: Pt counseled extensively on admission regarding risks/benefits of TOLAC vs repeat Cesarean. Pt confirmed desire for TOLAC on admission.  2141, MD 2:01 AM

## 2020-03-12 NOTE — Anesthesia Procedure Notes (Signed)
Epidural Patient location during procedure: OB Start time: 03/12/2020 7:43 AM End time: 03/12/2020 7:49 AM  Staffing Anesthesiologist: Cecile Hearing, MD Performed: anesthesiologist   Preanesthetic Checklist Completed: patient identified, IV checked, risks and benefits discussed, monitors and equipment checked, pre-op evaluation and timeout performed  Epidural Patient position: sitting Prep: DuraPrep Patient monitoring: blood pressure and continuous pulse ox Approach: midline Location: L3-L4 Injection technique: LOR air  Needle:  Needle type: Tuohy  Needle gauge: 17 G Needle length: 9 cm Needle insertion depth: 5 cm Catheter size: 19 Gauge Catheter at skin depth: 10 cm Test dose: negative and Other (1% Lidocaine)  Additional Notes Patient identified.  Risk benefits discussed including failed block, incomplete pain control, headache, nerve damage, paralysis, blood pressure changes, nausea, vomiting, reactions to medication both toxic or allergic, and postpartum back pain.  Patient expressed understanding and wished to proceed.  All questions were answered.  Sterile technique used throughout procedure and epidural site dressed with sterile barrier dressing. No paresthesia or other complications noted. The patient did not experience any signs of intravascular injection such as tinnitus or metallic taste in mouth nor signs of intrathecal spread such as rapid motor block. Please see nursing notes for vital signs. Reason for block:procedure for pain

## 2020-03-12 NOTE — Anesthesia Preprocedure Evaluation (Signed)
Anesthesia Evaluation  Patient identified by MRN, date of birth, ID band Patient awake    Reviewed: Allergy & Precautions, NPO status , Patient's Chart, lab work & pertinent test results  Airway Mallampati: II  TM Distance: >3 FB Neck ROM: Full    Dental  (+) Teeth Intact   Pulmonary neg pulmonary ROS,    Pulmonary exam normal breath sounds clear to auscultation       Cardiovascular hypertension, Normal cardiovascular exam Rhythm:Regular Rate:Normal     Neuro/Psych negative neurological ROS     GI/Hepatic negative GI ROS, Neg liver ROS,   Endo/Other  negative endocrine ROS  Renal/GU negative Renal ROS     Musculoskeletal negative musculoskeletal ROS (+)   Abdominal   Peds  Hematology negative hematology ROS (+) Plt 189k   Anesthesia Other Findings Day of surgery medications reviewed with the patient.  Reproductive/Obstetrics (+) Pregnancy                             Anesthesia Physical Anesthesia Plan  ASA: II  Anesthesia Plan: Epidural   Post-op Pain Management:    Induction:   PONV Risk Score and Plan: 2 and Treatment may vary due to age or medical condition  Airway Management Planned: Natural Airway  Additional Equipment:   Intra-op Plan:   Post-operative Plan:   Informed Consent: I have reviewed the patients History and Physical, chart, labs and discussed the procedure including the risks, benefits and alternatives for the proposed anesthesia with the patient or authorized representative who has indicated his/her understanding and acceptance.     Interpreter used for [employer] Discussed with:   Anesthesia Plan Comments: (Patient identified. Risks/Benefits/Options discussed with patient including but not limited to bleeding, infection, nerve damage, paralysis, failed block, incomplete pain control, headache, blood pressure changes, nausea, vomiting, reactions to  medication both or allergic, itching and postpartum back pain. Confirmed with bedside nurse the patient's most recent platelet count. Confirmed with patient that they are not currently taking any anticoagulation, have any bleeding history or any family history of bleeding disorders. Patient expressed understanding and wished to proceed. All questions were answered. )        Anesthesia Quick Evaluation

## 2020-03-12 NOTE — Progress Notes (Signed)
Labor Progress Note Kristina Gordon is a 39 y.o. G3P1011 at [redacted]w[redacted]d presented for IOL 2/2 gHTN.  S: Feeling some cramping but overall doing well.   O:  BP 137/71    Pulse 79    Temp 98.1 F (36.7 C) (Oral)    Resp 18    Ht 4\' 8"  (1.422 m)    Wt 55.9 kg    LMP 06/23/2019 (Exact Date)    SpO2 100%    BMI 27.64 kg/m  EFM: baseline 140/ moderate variability /+accels, no decels  CVE: Dilation: 9 Effacement (%): 100 Station: -3 Presentation: Vertex Exam by:: [email or web address]   A&P: 39 y.o. 41 [redacted]w[redacted]d presented for IOL 2/2 gHTN.   #Labor/TOLAC: Started on pitocin at 10/12 1725. S/p FB placement with speculum at 2140, out at 10/3 0639. AROM and IUPC placement at 1730. MVU's at around 180, patient has progressed to 9cm.   #Pain: Epidural #FWB: Category 1 strip #GBS negative #gHTN: Preeclampsia labs unremarkable on admission (UP:C 0.13). Has had MR pressures since admission.  #TOLAC: Pt counseled extensively on admission regarding risks/benefits of TOLAC vs repeat Cesarean. Pt confirmed desire for TOLAC on admission.  2141, MD 9:49 PM

## 2020-03-12 NOTE — Progress Notes (Signed)
Labor Progress Note Kristina Gordon is a 39 y.o. G3P1011 at [redacted]w[redacted]d presented for IOL 2/2 gHTN.  S: Pt comfortable on her epidural. No complaints. Feeling baby move normally. Some pressure with contractions but not too bad.   O:  BP 109/63   Pulse 69   Temp 98.2 F (36.8 C) (Oral)   Resp 17   Ht 4\' 8"  (1.422 m)   Wt 55.9 kg   LMP 06/23/2019 (Exact Date)   SpO2 99%   BMI 27.64 kg/m  EFM: baseline 140/moderate variability/+accels/no decels  CVE: Dilation: 4 Effacement (%): 60 Station: -3 Presentation: Vertex Exam by:: [email or web address], RN   A&P: 39 y.o. 41 [redacted]w[redacted]d presented for IOL 2/2 gHTN. #Labor: Started on pitocin at 10/12 1725. S/p FB placement with speculum at 2140, out at 10/3 0639. Pit at 10 at 1433 #Pain: Epidural #FWB: Category 1 strip #GBS negative #gHTN: blood pressures normal to mild range. Preeclampsia labs unremarkable on admission (UP:C 0.13). #TOLAC: Pt counseled extensively on admission regarding risks/benefits of TOLAC vs repeat Cesarean. Pt confirmed desire for TOLAC on admission.  2141, MD 2:29 PM

## 2020-03-12 NOTE — Progress Notes (Addendum)
Labor Progress Note Kristina Gordon is a 39 y.o. G3P1011 at [redacted]w[redacted]d presented for IOL 2/2 gHTN.  S: Pt comfortable on her epidural. No complaints.  O:  BP (!) 144/79   Pulse 66   Temp 98.1 F (36.7 C) (Oral)   Resp 17   Ht 4\' 8"  (1.422 m)   Wt 55.9 kg   LMP 06/23/2019 (Exact Date)   SpO2 100%   BMI 27.64 kg/m  EFM: baseline 130/ moderate variability /+accels, no decels  CVE: Dilation: 4 Effacement (%): 70, 80 Station: -3 Presentation: Vertex Exam by:: [email or web address], MD   A&P: 39 y.o. 41 [redacted]w[redacted]d presented for IOL 2/2 gHTN. #Labor: Started on pitocin at 10/12 1725. S/p FB placement with speculum at 2140, out at 10/3 0639. AROM and IUPC placement at 1730.  #Pain: Epidural #FWB: Category 1 strip #GBS negative #gHTN: Preeclampsia labs unremarkable on admission (UP:C 0.13). BP last four hours (109-150)/(63-99), last 144/79.  #TOLAC: Pt counseled extensively on admission regarding risks/benefits of TOLAC vs repeat Cesarean. Pt confirmed desire for TOLAC on admission.  2141, MD 6:18 PM

## 2020-03-13 ENCOUNTER — Encounter (HOSPITAL_COMMUNITY): Payer: Self-pay | Admitting: Family Medicine

## 2020-03-13 ENCOUNTER — Encounter: Payer: 59 | Admitting: Obstetrics and Gynecology

## 2020-03-13 DIAGNOSIS — O34219 Maternal care for unspecified type scar from previous cesarean delivery: Secondary | ICD-10-CM

## 2020-03-13 DIAGNOSIS — O139 Gestational [pregnancy-induced] hypertension without significant proteinuria, unspecified trimester: Secondary | ICD-10-CM

## 2020-03-13 DIAGNOSIS — O134 Gestational [pregnancy-induced] hypertension without significant proteinuria, complicating childbirth: Secondary | ICD-10-CM

## 2020-03-13 LAB — CBC
HCT: 31 % — ABNORMAL LOW (ref 36.0–46.0)
Hemoglobin: 10.7 g/dL — ABNORMAL LOW (ref 12.0–15.0)
MCH: 32.2 pg (ref 26.0–34.0)
MCHC: 34.5 g/dL (ref 30.0–36.0)
MCV: 93.4 fL (ref 80.0–100.0)
Platelets: 169 10*3/uL (ref 150–400)
RBC: 3.32 MIL/uL — ABNORMAL LOW (ref 3.87–5.11)
RDW: 13.6 % (ref 11.5–15.5)
WBC: 16.7 10*3/uL — ABNORMAL HIGH (ref 4.0–10.5)
nRBC: 0 % (ref 0.0–0.2)

## 2020-03-13 MED ORDER — PRENATAL MULTIVITAMIN CH
1.0000 | ORAL_TABLET | Freq: Every day | ORAL | Status: DC
  Administered 2020-03-13: 1 via ORAL
  Filled 2020-03-13: qty 1

## 2020-03-13 MED ORDER — TRANEXAMIC ACID-NACL 1000-0.7 MG/100ML-% IV SOLN
1000.0000 mg | INTRAVENOUS | Status: AC
  Administered 2020-03-13: 1000 mg via INTRAVENOUS

## 2020-03-13 MED ORDER — ONDANSETRON HCL 4 MG PO TABS: 4.0000 mg | ORAL_TABLET | ORAL | Status: DC | PRN

## 2020-03-13 MED ORDER — IBUPROFEN 600 MG PO TABS
600.0000 mg | ORAL_TABLET | Freq: Four times a day (QID) | ORAL | Status: DC
  Administered 2020-03-13 – 2020-03-14 (×3): 600 mg via ORAL
  Filled 2020-03-13 (×5): qty 1

## 2020-03-13 MED ORDER — MEASLES, MUMPS & RUBELLA VAC IJ SOLR: 0.5000 mL | Freq: Once | INTRAMUSCULAR | Status: DC

## 2020-03-13 MED ORDER — DIBUCAINE (PERIANAL) 1 % EX OINT: 1.0000 "application " | TOPICAL_OINTMENT | CUTANEOUS | Status: DC | PRN

## 2020-03-13 MED ORDER — TRANEXAMIC ACID-NACL 1000-0.7 MG/100ML-% IV SOLN: 1000.0000 mg | Freq: Once | INTRAVENOUS | Status: DC | PRN

## 2020-03-13 MED ORDER — DOCUSATE SODIUM 100 MG PO CAPS
100.0000 mg | ORAL_CAPSULE | Freq: Two times a day (BID) | ORAL | Status: DC
  Administered 2020-03-14: 100 mg via ORAL
  Filled 2020-03-13: qty 1

## 2020-03-13 MED ORDER — ONDANSETRON HCL 4 MG/2ML IJ SOLN: 4.0000 mg | INTRAMUSCULAR | Status: DC | PRN

## 2020-03-13 MED ORDER — SENNOSIDES-DOCUSATE SODIUM 8.6-50 MG PO TABS: 2.0000 | ORAL_TABLET | ORAL | Status: DC

## 2020-03-13 MED ORDER — WITCH HAZEL-GLYCERIN EX PADS: 1.0000 "application " | MEDICATED_PAD | CUTANEOUS | Status: DC | PRN

## 2020-03-13 MED ORDER — DIPHENHYDRAMINE HCL 25 MG PO CAPS: 25.0000 mg | ORAL_CAPSULE | Freq: Four times a day (QID) | ORAL | Status: DC | PRN

## 2020-03-13 MED ORDER — TETANUS-DIPHTH-ACELL PERTUSSIS 5-2.5-18.5 LF-MCG/0.5 IM SUSP: 0.5000 mL | Freq: Once | INTRAMUSCULAR | Status: DC

## 2020-03-13 MED ORDER — ACETAMINOPHEN 325 MG PO TABS: 650.0000 mg | ORAL_TABLET | ORAL | Status: DC | PRN

## 2020-03-13 MED ORDER — BENZOCAINE-MENTHOL 20-0.5 % EX AERO: 1.0000 "application " | INHALATION_SPRAY | CUTANEOUS | Status: DC | PRN

## 2020-03-13 MED ORDER — SIMETHICONE 80 MG PO CHEW: 80.0000 mg | CHEWABLE_TABLET | ORAL | Status: DC | PRN

## 2020-03-13 MED ORDER — TRANEXAMIC ACID-NACL 1000-0.7 MG/100ML-% IV SOLN
INTRAVENOUS | Status: AC
  Filled 2020-03-13: qty 100

## 2020-03-13 MED ORDER — COCONUT OIL OIL: 1.0000 "application " | TOPICAL_OIL | Status: DC | PRN

## 2020-03-13 NOTE — Discharge Summary (Signed)
Postpartum Discharge Summary  Date of Service updated 03/14/20     Patient Name: Kristina Gordon DOB: [birth year] MRN: [id number]  Date of admission: 03/11/2020 Delivery date:03/13/2020  Delivering provider: Janet Berlin  Date of discharge: 03/14/2020  Admitting diagnosis: Encounter for induction of labor [Z34.90] Intrauterine pregnancy: [redacted]w[redacted]d    Secondary diagnosis:  Principal Problem:   Vaginal birth after cesarean Active Problems:   Language barrier   AMA (advanced maternal age) multigravida 361+  S/P cesarean section   Supervision of high risk pregnancy, antepartum   History of HELLP syndrome, currently pregnant   Bicornuate uterus   Marginal insertion of umbilical cord affecting management of mother   Encounter for induction of labor   Gestational hypertension  Additional problems: n/a    Discharge diagnosis: Term Pregnancy Delivered, VBAC and Gestational Hypertension                                              Post partum procedures:none Augmentation: AROM, Pitocin and IP Foley Complications: None  Hospital course: Induction of Labor With Vaginal Delivery   39y.o. yo G3P1011 at 360w5das admitted to the hospital 03/11/2020 for induction of labor.  Indication for induction: Gestational hypertension. Patient had repeat PEC labs on admission that were normal. She had MR pressures on admission, but none requiring IV antihypertensive therapy. Patient had an uncomplicated labor course as follows: Membrane Rupture Time/Date: 5:53 PM ,03/12/2020   Delivery Method:VBAC, Spontaneous  Episiotomy: None  Lacerations:  2nd degree  Details of delivery can be found in separate delivery note.  Patient had a routine postpartum course. Patient is discharged home 03/14/20.  Newborn Data: Birth date:03/13/2020  Birth time:1:47 AM  Gender:Female  Living status:Living  Apgars:8 ,9  Weight:2365 g   Magnesium Sulfate received: No BMZ received:  No Rhophylac:No MMR:N/A T-DaP:Given prenatally Flu: No Transfusion:No  Physical exam  Vitals:   03/14/20 0500 03/14/20 0509 03/14/20 0525 03/14/20 0603  BP: (!) 159/87 (!) 155/88 (!) 149/82 139/82  Pulse: 79 83 86 79  Resp: 18     Temp: 98.1 F (36.7 C)     TempSrc: Oral     SpO2: 100%     Weight:      Height:       General: alert, cooperative and no distress Lochia: appropriate Uterine Fundus: firm Incision: N/A DVT Evaluation: No evidence of DVT seen on physical exam. Labs: Lab Results  Component Value Date   WBC 16.7 (H) 03/13/2020   HGB 10.7 (L) 03/13/2020   HCT 31.0 (L) 03/13/2020   MCV 93.4 03/13/2020   PLT 169 03/13/2020   CMP Latest Ref Rng & Units 03/11/2020  Glucose 70 - 99 mg/dL 73  BUN 6 - 20 mg/dL 11  Creatinine 0.44 - 1.00 mg/dL 0.62  Sodium 135 - 145 mmol/L 133(L)  Potassium 3.5 - 5.1 mmol/L 3.8  Chloride 98 - 111 mmol/L 103  CO2 22 - 32 mmol/L 20(L)  Calcium 8.9 - 10.3 mg/dL 8.8(L)  Total Protein 6.5 - 8.1 g/dL 6.5  Total Bilirubin 0.3 - 1.2 mg/dL 0.4  Alkaline Phos 38 - 126 U/L 90  AST 15 - 41 U/L 25  ALT 0 - 44 U/L 17   Edinburgh Score: Edinburgh Postnatal Depression Scale Screening Tool 03/13/2020  I have been able to laugh and see the funny side of  things. 0  I have looked forward with enjoyment to things. 0  I have blamed myself unnecessarily when things went wrong. 0  I have been anxious or worried for no good reason. 0  I have felt scared or panicky for no good reason. 1  Things have been getting on top of me. 1  I have been so unhappy that I have had difficulty sleeping. 0  I have felt sad or miserable. 0  I have been so unhappy that I have been crying. 0  The thought of harming myself has occurred to me. 0  Edinburgh Postnatal Depression Scale Total 2     After visit meds:  Allergies as of 03/14/2020   No Known Allergies     Medication List    STOP taking these medications   aspirin EC 81 MG tablet     TAKE these  medications   amLODipine 5 MG tablet Commonly known as: NORVASC Take 1 tablet (5 mg total) by mouth daily.   ibuprofen 600 MG tablet Commonly known as: ADVIL Take 1 tablet (600 mg total) by mouth every 6 (six) hours.   Prenatal/Iron Tabs Take 1 tablet by mouth daily.        Discharge home in stable condition Infant Feeding: Bottle and Breast Infant Disposition:home with mother Discharge instruction: per After Visit Summary and Postpartum booklet. Activity: Advance as tolerated. Pelvic rest for 6 weeks.  Diet: routine diet Future Appointments:No future appointments. Follow up Visit:  [place] [facility] [place] [facility] Follow up in 4 week(s).   Specialty: Obstetrics and Gynecology Contact information: [place] [address] [phone]               Please schedule this patient for a In person postpartum visit in 6 weeks with the following provider: Any provider. Additional Postpartum F/U:BP check 1 week  High risk pregnancy complicated by: HTN and VBAC Delivery mode:  VBAC, Spontaneous  Anticipated Birth Control:  POPs. Pt prefers to wait x 3 months before starting pills.   03/14/2020 Fatima Blank, CNM

## 2020-03-13 NOTE — Lactation Note (Addendum)
This note was copied from a baby's chart. Lactation Consultation Note  Patient Name: Kristina Gordon Today's Date: 03/13/2020 Reason for consult: Initial assessment;Early term 37-38.6wks  Consult was done in Spanish.-  Initial visit with 11 hours old infant of P2 mother with 2 years of breastfeeding experience. Mother states infant has a good latch and able to transfer milk. Mother has been supplementing with 10 mL of formula by provider's recommendation. Talked to mother about hand expression, demonstrated techinque and able to collect ~74mL. Spoon and finger-fed colostrum. Assisted mother with modified cradle latch to right breast. Provided support with pillows. Infant able to latch successfully after a couple of attempts. Noted suckling and swallowing. Mother massaged and compressed breast. Baby unlatched after 20 minutes, Mother burped infant. Infant fell asleep and placed skin to skin. Discussed pacing while bottle-feeding formula, demonstrated technique. Assisted to upright position and infant took ~50mL of formula. Mother burped infant again and later back to STS.   Demonstrated how to use DEBP to stimulate breasts. Reviewed milk storage and proper care of parts.    Promoted maternal rest, hydration and food intake. Encouraged to contact [facility] for support when ready to breastfeed baby and recommended to request help for questions or concerns. Provided LC services brochure.    Plan: 1-Breastfeed no longer than 20 minutes to preserve energy 2-Pump using initiation setting or hand express to supplement 3-Spoon or fingerfeed EBM  4-Pacing and upright position when bottle-feeding formula. 5-Contact LC for support   All questions answered at this time.   Maternal Data Formula Feeding for Exclusion: No Has patient been taught Hand Expression?: Yes Does the patient have breastfeeding experience prior to this delivery?: Yes  Feeding Feeding Type: Breast Fed Nipple Type: Slow -  flow  LATCH Score Latch: Grasps breast easily, tongue down, lips flanged, rhythmical sucking.  Audible Swallowing: Spontaneous and intermittent  Type of Nipple: Everted at rest and after stimulation  Comfort (Breast/Nipple): Soft / non-tender  Hold (Positioning): Assistance needed to correctly position infant at breast and maintain latch.  LATCH Score: 9  Interventions Interventions: Breast feeding basics reviewed;Assisted with latch;Skin to skin;Breast massage;Hand express;Adjust position;Support pillows;Position options;Expressed milk;DEBP  Lactation Tools Discussed/Used WIC Program: No Pump Review: Setup, frequency, and cleaning;Milk Storage Initiated by:: Darwin Guastella IBCLC Date initiated:: 03/13/20   Consult Status Consult Status: Follow-up Date: 03/14/20 Follow-up type: In-patient    Kristina Gordon 03/13/2020, 1:10 PM

## 2020-03-14 ENCOUNTER — Other Ambulatory Visit (HOSPITAL_COMMUNITY): Payer: Self-pay | Admitting: Family Medicine

## 2020-03-14 MED ORDER — AMLODIPINE BESYLATE 5 MG PO TABS
5.0000 mg | ORAL_TABLET | Freq: Every day | ORAL | Status: DC
  Administered 2020-03-14: 5 mg via ORAL
  Filled 2020-03-14: qty 1

## 2020-03-14 MED ORDER — AMLODIPINE BESYLATE 5 MG PO TABS: 5.0000 mg | ORAL_TABLET | Freq: Every day | ORAL | 2 refills | Status: DC

## 2020-03-14 MED ORDER — AMLODIPINE BESYLATE 5 MG PO TABS: 5.0000 mg | ORAL_TABLET | Freq: Every day | ORAL | 2 refills | Status: AC

## 2020-03-14 MED ORDER — IBUPROFEN 600 MG PO TABS
600.0000 mg | ORAL_TABLET | Freq: Four times a day (QID) | ORAL | 0 refills | Status: DC
Start: 2020-03-14 — End: 2020-03-14

## 2020-03-14 MED ORDER — IBUPROFEN 600 MG PO TABS: 600.0000 mg | ORAL_TABLET | Freq: Four times a day (QID) | ORAL | 0 refills | Status: DC

## 2020-03-14 MED FILL — IBUPROFEN 600 MG TABLET: 600 | 7 days supply | Qty: 30 | Fill #0

## 2020-03-14 MED FILL — AMLODIPINE BESYLATE 5 MG TA: 5 | 30 days supply | Qty: 30 | Fill #0

## 2020-03-14 NOTE — Anesthesia Postprocedure Evaluation (Signed)
Anesthesia Post Note  Patient: Kristina Gordon  Procedure(s) Performed: AN AD HOC LABOR EPIDURAL     Patient location during evaluation: Mother Baby Anesthesia Type: Epidural Level of consciousness: awake and alert and oriented Pain management: satisfactory to patient Vital Signs Assessment: post-procedure vital signs reviewed and stable Respiratory status: respiratory function stable Cardiovascular status: stable Postop Assessment: no headache, no backache, epidural receding, patient able to bend at knees, no signs of nausea or vomiting, adequate PO intake and able to ambulate Anesthetic complications: no   No complications documented.  Last Vitals:  Vitals:   03/14/20 0525 03/14/20 0603  BP: (!) 149/82 139/82  Pulse: 86 79  Resp:    Temp:    SpO2:      Last Pain:  Vitals:   03/14/20 0500  TempSrc: Oral  PainSc: 8    Pain Goal:                   Eutimio Gharibian

## 2020-03-14 NOTE — Lactation Note (Addendum)
This note was copied from a baby's chart. Lactation Consultation Note  Patient Name: [name] [id number] Date: 03/14/2020 Reason for consult: Follow-up assessment;Early term 37-38.6wks;Infant < 6lbs [name] now 64 hours old with 5 percent weight loss.  [name] ETI and less than 6 pounds.  Spanish interpreter Kristina Gordon interpreted.   Mom reports she is not pumping because she isn't getting anything.  Explained to mom that was normal that the first few days when you have colostrum that it is harder to get out with DEBP.  Mom reports able to hand express easily. Reviewed with mom the pumping is more for stimulation to help it come faster and if she gets anything its a bonus. Urged mom to pump and hand express and feed back all expressed mothers milk past breastfeedings instead of formula if she has it. Mom asked how much.  Gave mom LPTI sheet in spanish and reviewed it.  Kristina Gordon breastfeeding on arrival and  fell asleep and mom reported she was done.  She was still a little tight and tense.  Unswaddled Kristina Gordon and did some hand expression into her mouth and she woke up and vigorously latched again.  Showed mom how to tell when infant also done feeding. Urged mom to offer both breasts at a meal. Discussed WIC referral.  Mom plans to apply for emergency Medicaid for her and medicaid for Kristina Gordon.   Mom reports she was over income before. Mom reports if she has to buy an electric breastpump she will do so.  Took mom  A list of good cheaper breastpumps and where to buy.  LC spoke with social worker regarding medicaid.  Since mom has private insurance they cant enroll the baby here.  However, Child psychotherapist gave LC a Medela DEBP Pump N style.  LC gave to mom and demo how to use it.  Parents were happy. Showed mom how to use it. Showed her how to use symphony parts  with her DEBP.  Decided they do not want to do [facility] referral now that they have a DEBP. Mom has manual pump for home use and knows how  to use it as well.  Praised breastfeeding.  Urged mom to call lactation as needed.    Maternal Data Formula Feeding for Exclusion: Yes Reason for exclusion: Mother's choice to formula and breast feed on admission Has patient been taught Hand Expression?: Yes  Feeding Feeding Type: Breast Fed  LATCH Score Latch: Grasps breast easily, tongue down, lips flanged, rhythmical sucking.  Audible Swallowing: Spontaneous and intermittent  Type of Nipple: Everted at rest and after stimulation  Comfort (Breast/Nipple): Soft / non-tender  Hold (Positioning): No assistance needed to correctly position infant at breast.  LATCH Score: 10  Interventions Interventions: Breast feeding basics reviewed;Hand express;DEBP  Lactation Tools Discussed/Used WIC Program: No   Consult Status Consult Status: Follow-up Date: 03/15/20 Follow-up type: In-patient    Kristina Gordon 03/14/2020, 1:19 PM

## 2020-03-17 ENCOUNTER — Ambulatory Visit: Payer: 59 | Admitting: *Deleted

## 2020-03-17 ENCOUNTER — Other Ambulatory Visit: Payer: Self-pay

## 2020-03-17 VITALS — Wt 114.1 lb

## 2020-03-17 DIAGNOSIS — Z0130 Encounter for examination of blood pressure without abnormal findings: Secondary | ICD-10-CM

## 2020-03-17 NOTE — Progress Notes (Signed)
Interpreter [employer] present for encounter. Pt here for visit after calling office earlier this morning and stated that she has been having high BP readings yesterday and today. She reports having a light H/A - pain scale 2 and does not require any  medication for the pain. She denies all visual disturbances. She endorses that she has been taking amlodipine as prescribed on 10/15.  BP - 134/86, P - 74 .  Upon further discussion w/pt she states that she does not sit calmly/quietly prior to checking her BP.  She was recommended to follow this procedure and to check BP once daily unless she is having sx of pre-eclampsia. Pt voiced understanding of all information and instructions given. PP visit scheduled on 04/17/20.

## 2020-03-18 NOTE — Progress Notes (Signed)
Patient seen and assessed by nursing staff.  Agree with documentation and plan.  

## 2020-04-17 ENCOUNTER — Ambulatory Visit: Payer: 59 | Admitting: Obstetrics and Gynecology

## 2020-04-22 ENCOUNTER — Ambulatory Visit (INDEPENDENT_AMBULATORY_CARE_PROVIDER_SITE_OTHER): Payer: 59 | Admitting: Advanced Practice Midwife

## 2020-04-22 ENCOUNTER — Other Ambulatory Visit: Payer: Self-pay

## 2020-04-22 ENCOUNTER — Encounter: Payer: Self-pay | Admitting: Advanced Practice Midwife

## 2020-04-22 DIAGNOSIS — O99893 Other specified diseases and conditions complicating puerperium: Secondary | ICD-10-CM

## 2020-04-22 DIAGNOSIS — R509 Fever, unspecified: Secondary | ICD-10-CM

## 2020-04-22 DIAGNOSIS — Z8759 Personal history of other complications of pregnancy, childbirth and the puerperium: Secondary | ICD-10-CM

## 2020-04-22 DIAGNOSIS — R0981 Nasal congestion: Secondary | ICD-10-CM

## 2020-04-22 MED ORDER — NORGESTIMATE-ETH ESTRADIOL 0.25-35 MG-MCG PO TABS: 1.0000 | ORAL_TABLET | Freq: Every day | ORAL | 11 refills | Status: AC

## 2020-04-22 NOTE — Patient Instructions (Signed)
AREA FAMILY PRACTICE PHYSICIANS  [place] ([id number]) . [facility] o [address] [place], [place] [address] o [phone] o Mon-Fri 8:30-12:30, 1:30-5:00 o Accepting Medicaid . [facility] o [address], [place], [place] [address] o [phone] o Mon-Fri 8:00-5:30 . [facility] o [address] [place], [place] [address] o [phone] o Mon, Tue, Thur, Fri 8:30-5:00, Wed 10:00-7:00 (closed 1-2pm) o Accepting Medicaid . [facility] o [address], [place], [place]  [address] o Phone - [phone]   Fax - [phone]  [place] ([address]) . [facility] o [address] [place], [place] [address] o [phone] o Mon-Fri 8:00-5:00 . [facility] ([facility]  o [address] [place], [place] [address] o [phone] o Mon-Fri 8:30-5:30, Sat (Oct.-Mar.) 9:00-1:00 o Accepting Medicaid  [place] ([address]) . [facility] o [address], [place], [place] [address] o [phone] o Mon-Fri 8:00-5:00  [place] ([address]) . [facility] o [address], [place], [place] [address] o [phone] o Mon-Fri 8:00-5:00 . [facility] o [address], [place], [place] [address] o [phone] o Mon-Fri 8:00-5:00 . [facility] o [address] [place], [place] [address] o [phone] o Mon-Fri 8:00-5:00 . [facility] o [address] [place] [address] o [phone] o Mon-Fri 7:30-5:30  [place] ([address] & [address]) . [facility] o [address] [place], [place] [address] o [phone] o Mon-Thur 8:00-6:00 o Accepting Medicaid . [facility] o [address] [place], [place] [address] o [phone] o Mon-Thur 7:30-7:30, Fri 7:30-4:30 o Accepting Medicaid . [facility] o [address], [place], [place]  [address] o [phone]   Fax - [phone]  [place] ([address] & [address]) . [facility] o [address] [place], [place] [address] o [phone] o Mon-Fri 7:00-5:00 . [facility] o [address], [place], [place] [address] o [phone] o Mon-Fri 8:00-5:00 o Accepting Medicaid . [facility] o [address], , [place] [address] o [phone] o Mon-Fri 8:00-5:00 o Accepting Medicaid  [place] ([address]) . [facility] [place] o [address] [place], [place] [address] o [phone] o Mon-Fri 8:00-5:00 . [facility] o [address], [place], [place] [address] o [phone] o Mon-Fri 8:00-5:00 o Accepting Medicaid . [facility] o [address], [place], [place] [address] o [phone] o Mon-Fri 8:00-5:30, Sat&Sun by appointment (phones open at 8:30) o Accepting Medicaid  [place] ([address]) . [facility] o [address] [place], [place] [address] o [phone] o Mon-Thur 8:00-7:00, Fri 8:00-5:00, Sat 8:00-12:00, Sun 9:00-12:00 o Accepting Medicaid . [facility] o [address], [place], [place] [address] o [phone] o Mon-Thur 8:00-5:00 o Accepting Medicaid . [facility] o [address] [place], [place] [address] o [phone] o Mon-Fri 8:00-5:30, Sat (Oct.-Mar.) 9:00-1:00 o Accepting Medicaid  [place]  ([address]) .   [facility] o [address], [place], [place] [address] o [phone] o Mon-Fri 8:00-5:00 o Accepting Medicaid   [place] ([address]) . [facility] [place] o [address], [place], [place] [address] o [phone] o Mon-Fri 8:00-5:00 . [facility] [place] o [address], [place], [place] [address] o [phone] o Mon-Fri 8:00-5:00 . [facility] [place] o [address] [place] [address], [place], [place] [address] o [phone] o Mon-Fri 8:00-5:00 o After hours clinic ([address] [place], [place] [address]) [phone] Mon-Fri 5:00-8:00, Sat 12:00-6:00, Sun 10:00-4:00 o Accepting Medicaid . [facility] [place] o [address], [place], [place]  [address] o [phone]   Fax - [phone]  [place] ([address]) . [facility] [place] [facility] o [address], [place], [place] [address] o [phone] o Mon-Fri 8:00-5:00 . [facility] [place] ([facility] [place]) o [address], [place], [place] [address] o [phone] o Mon-Thur 8:00-7:00, Fri 8:00-5:00, Sat 8:00-12:00    

## 2020-04-22 NOTE — Progress Notes (Signed)
Post Partum Visit Note  Kristina Gordon is a 39 y.o. G2P2012 female who presents for a postpartum visit. She is 5 weeks postpartum following a normal spontaneous vaginal delivery.  I have fully reviewed the prenatal and intrapartum course. The delivery was at 37/5 gestational weeks.  Anesthesia: epidural. Postpartum course has been uncomplicated. Baby is doing well. Baby is feeding by bottle - Carnation Good Start. Bleeding no bleeding. Bowel function is abnormal: little constipated/ with hemorrhoid pain. Bladder function is abnormal: cant make it to the bathroom on time. Patient is not sexually active. Contraception method is none. Postpartum depression screening: negative.   The pregnancy intention screening data noted above was reviewed. Potential methods of contraception were discussed. The patient elected to proceed with Oral Contraceptive.    Edinburgh Postnatal Depression Scale - 04/22/20 1627      Edinburgh Postnatal Depression Scale:  In the Past 7 Days   I have been able to laugh and see the funny side of things. 0    I have looked forward with enjoyment to things. 0    I have blamed myself unnecessarily when things went wrong. 0    I have been anxious or worried for no good reason. 0    I have felt scared or panicky for no good reason. 0    Things have been getting on top of me. 0    I have been so unhappy that I have had difficulty sleeping. 0    I have felt sad or miserable. 0    I have been so unhappy that I have been crying. 0    The thought of harming myself has occurred to me. 0    Edinburgh Postnatal Depression Scale Total 0            The following portions of the patient's history were reviewed and updated as appropriate: allergies, current medications, past family history, past medical history, past social history, past surgical history and problem list.  Review of Systems Pertinent items are noted in HPI.    Objective:  BP 136/86   Pulse 87   Ht 4'  11" (1.499 m)   Wt 103 lb 9.6 oz (47 kg)   LMP 06/23/2019 (Exact Date)   Breastfeeding No   BMI 20.92 kg/m    Physical Exam Vitals and nursing note reviewed.  HENT:     Head: Normocephalic.  Cardiovascular:     Rate and Rhythm: Normal rate.  Pulmonary:     Effort: Pulmonary effort is normal.  Abdominal:     Palpations: Abdomen is soft.  Skin:    General: Skin is warm and dry.  Neurological:     Mental Status: She is alert and oriented to person, place, and time.  Psychiatric:        Mood and Affect: Mood normal.        Behavior: Behavior normal.    Assessment:    Routine postpartum exam. Pap smear not done at today's visit.   Plan:   Essential components of care per ACOG recommendations:  1.  Mood and well being: Patient with negative depression screening today. Reviewed local resources for support.  - Patient does not use tobacco. NA If using tobacco we discussed reduction and for recently cessation risk of relapse - hx of drug use? No  NA If yes, discussed support systems  2. Infant care and feeding:  -Patient currently breastmilk feeding? No NA If breastmilk feeding discussed return to work and pumping.  If needed, patient was provided letter for work to allow for every 2-3 hr pumping breaks, and to be granted a private location to express breastmilk and refrigerated area to store breastmilk. Reviewed importance of draining breast regularly to support lactation. -Social determinants of health (SDOH) reviewed in EPIC. No concerns identified.   3. Sexuality, contraception and birth spacing - Patient does not want a pregnancy in the next year.  Desired family size is 2 children.  - Reviewed forms of contraception in tiered fashion. Patient desired oral contraceptives (estrogen/progesterone) today.   - Discussed birth spacing of 18 months  4. Sleep and fatigue -Encouraged family/partner/community support of 4 hrs of uninterrupted sleep to help with mood and fatigue  5.  Physical Recovery  - Discussed patients delivery and complications - Patient had a 2nd degree laceration, perineal healing reviewed. Patient expressed understanding - Patient has urinary incontinence? No  - Patient is safe to resume physical and sexual activity  6.  Health Maintenance - Last pap smear done 11/2019 and was normal with negative HPV. Needs Mammogram  7. Patient here with covid symptoms today: sinus congestion, fever, chills and headache. - Patient advised to get a Covid test done ASAP  Thressa Sheller DNP, CNM  04/22/20  4:57 PM

## 2020-04-27 ENCOUNTER — Encounter (HOSPITAL_COMMUNITY): Payer: Self-pay

## 2020-04-27 ENCOUNTER — Other Ambulatory Visit: Payer: Self-pay

## 2020-04-27 ENCOUNTER — Emergency Department (HOSPITAL_COMMUNITY): Payer: 59

## 2020-04-27 ENCOUNTER — Emergency Department (HOSPITAL_COMMUNITY)
Admission: EM | Admit: 2020-04-27 | Discharge: 2020-04-27 | Disposition: A | Discharge status: Home / Self Care | Payer: 59 | Attending: Emergency Medicine | Admitting: Emergency Medicine

## 2020-04-27 DIAGNOSIS — R059 Cough, unspecified: Secondary | ICD-10-CM | POA: Diagnosis present

## 2020-04-27 DIAGNOSIS — U071 COVID-19: Secondary | ICD-10-CM | POA: Diagnosis not present

## 2020-04-27 LAB — CBC WITH DIFFERENTIAL/PLATELET
Abs Immature Granulocytes: 0.01 10*3/uL (ref 0.00–0.07)
Basophils Absolute: 0 10*3/uL (ref 0.0–0.1)
Basophils Relative: 0 %
Eosinophils Absolute: 0 10*3/uL (ref 0.0–0.5)
Eosinophils Relative: 0 %
HCT: 37.1 % (ref 36.0–46.0)
Hemoglobin: 11.8 g/dL — ABNORMAL LOW (ref 12.0–15.0)
Immature Granulocytes: 0 %
Lymphocytes Relative: 18 %
Lymphs Abs: 0.6 10*3/uL — ABNORMAL LOW (ref 0.7–4.0)
MCH: 28.6 pg (ref 26.0–34.0)
MCHC: 31.8 g/dL (ref 30.0–36.0)
MCV: 90 fL (ref 80.0–100.0)
Monocytes Absolute: 0.2 10*3/uL (ref 0.1–1.0)
Monocytes Relative: 6 %
Neutro Abs: 2.4 10*3/uL (ref 1.7–7.7)
Neutrophils Relative %: 76 %
Platelets: 242 10*3/uL (ref 150–400)
RBC: 4.12 MIL/uL (ref 3.87–5.11)
RDW: 14.2 % (ref 11.5–15.5)
WBC: 3.2 10*3/uL — ABNORMAL LOW (ref 4.0–10.5)
nRBC: 0 % (ref 0.0–0.2)

## 2020-04-27 LAB — COMPREHENSIVE METABOLIC PANEL
ALT: 25 U/L (ref 0–44)
AST: 31 U/L (ref 15–41)
Albumin: 3.7 g/dL (ref 3.5–5.0)
Alkaline Phosphatase: 56 U/L (ref 38–126)
Anion gap: 10 (ref 5–15)
BUN: 13 mg/dL (ref 6–20)
CO2: 23 mmol/L (ref 22–32)
Calcium: 8 mg/dL — ABNORMAL LOW (ref 8.9–10.3)
Chloride: 108 mmol/L (ref 98–111)
Creatinine, Ser: 0.9 mg/dL (ref 0.44–1.00)
GFR, Estimated: >60 mL/min (ref >60)
Glucose, Bld: 102 mg/dL — ABNORMAL HIGH (ref 70–99)
Potassium: 3.7 mmol/L (ref 3.5–5.1)
Sodium: 141 mmol/L (ref 135–145)
Total Bilirubin: 0.3 mg/dL (ref 0.3–1.2)
Total Protein: 7.3 g/dL (ref 6.5–8.1)

## 2020-04-27 LAB — RESP PANEL BY RT-PCR (FLU A&B, COVID) ARPGX2
Influenza A by PCR: NEGATIVE
Influenza B by PCR: NEGATIVE
SARS Coronavirus 2 by RT PCR: POSITIVE — AB

## 2020-04-27 LAB — TROPONIN I (HIGH SENSITIVITY)
Troponin I (High Sensitivity): <2 ng/L (ref <18)
Troponin I (High Sensitivity): 2 ng/L (ref <18)

## 2020-04-27 MED ORDER — ACETAMINOPHEN 325 MG PO TABS
650.0000 mg | ORAL_TABLET | Freq: Once | ORAL | Status: AC
  Administered 2020-04-27: 650 mg via ORAL
  Filled 2020-04-27: qty 2

## 2020-04-27 NOTE — ED Triage Notes (Signed)
Per interpreter, patient and son had a covid exposure 2 weeks ago and symptoms for both started 1 week ago.  Patient is having loss of taste and smell, headache 6/10, fatigue, chest pain 8/10 for 1 week.  Patient states she took a home covid test on Wednesday and it was positive.

## 2020-04-27 NOTE — ED Provider Notes (Signed)
I assumed care of patient at shift change from previous team, please see their note for full H&P. Briefly patient had a positive Covid test at home and is here for evaluation of Covid symptoms with chest pain.  All my interactions with patient were performed through professional Spanish-speaking medical interpreter. Physical Exam  BP 121/82   Pulse 83   Temp 98.5 F (36.9 C) (Oral)   Resp 16   Ht 4\' 11"  (1.499 m)   Wt 46.7 kg   LMP 06/23/2019 (Exact Date)   SpO2 99%   BMI 20.80 kg/m   Physical Exam Patient in bed, in no obvious distress.  She is awake and alert, respirations are even and unlabored.  Speech is not slurred.  She gets up out of bed without difficulty.   ED Course/Procedures     Procedures  DG Chest Portable 1 View  Result Date: 04/27/2020 CLINICAL DATA:  Chest pain, fatigue.  COVID-19 exposure EXAM: PORTABLE CHEST 1 VIEW COMPARISON:  None. FINDINGS: The heart size and mediastinal contours are within normal limits. Hazy interstitial opacities within the mid to lower lung fields, right worse than left. No pleural effusion or pneumothorax. The visualized skeletal structures are unremarkable. IMPRESSION: Hazy interstitial opacities within the mid to lower lung fields, right worse than left, suspicious for atypical/viral pneumonia. Electronically Signed   By: 04/29/2020 D.O.   On: 04/27/2020 13:44    Labs Reviewed  RESP PANEL BY RT-PCR (FLU A&B, COVID) ARPGX2 - Abnormal; Notable for the following components:      Result Value   SARS Coronavirus 2 by RT PCR POSITIVE (*)    All other components within normal limits  CBC WITH DIFFERENTIAL/PLATELET - Abnormal; Notable for the following components:   WBC 3.2 (*)    Hemoglobin 11.8 (*)    Lymphs Abs 0.6 (*)    All other components within normal limits  COMPREHENSIVE METABOLIC PANEL - Abnormal; Notable for the following components:   Glucose, Bld 102 (*)    Calcium 8.0 (*)    All other components within normal  limits  TROPONIN I (HIGH SENSITIVITY)  TROPONIN I (HIGH SENSITIVITY)     MDM  Patient is a 39 year old woman here for suspected COVID-19, plan is to follow-up on labs, anticipate discharge.  Patient is not hypoxic and does not require admission.  CBC shows minimal anemia.  CMP is unremarkable.  Covid test is positive.  Troponin is not elevated.  Chest x-ray is consistent with Covid.  EKG without clear ischemia.  I discussed these results with patient.  Meghna Hagmann was evaluated in Emergency Department on 04/27/2020 for the symptoms described in the history of present illness. She was evaluated in the context of the global COVID-19 pandemic, which necessitated consideration that the patient might be at risk for infection with the SARS-CoV-2 virus that causes COVID-19. Institutional protocols and algorithms that pertain to the evaluation of patients at risk for COVID-19 are in a state of rapid change based on information released by regulatory bodies including the CDC and federal and state organizations. These policies and algorithms were followed during the patient's care in the ED.  Return precautions were discussed with patient who states their understanding.  At the time of discharge patient denied any unaddressed complaints or concerns.  Patient is agreeable for discharge home.  Note: Portions of this report may have been transcribed using voice recognition software. Every effort was made to ensure accuracy; however, inadvertent computerized transcription errors may be present  Cristina Gong, PA-C 04/27/20 2354    Arby Barrette, MD 05/09/20 1921

## 2020-04-27 NOTE — ED Provider Notes (Addendum)
[facility]-EMERGENCY DEPT Provider Note   CSN: [id number] Arrival date & time: 04/27/20  1146     History Chief Complaint  Patient presents with  . Covid Exposure  . Chest Pain    Nalaysia Manganiello is a 39 y.o. female.  HPI   Pt is a 39 y/o female with a h/o anemia, HELLP, ovarian cyst, who presents to the ED today for evaluation for covid sxs.  She is c/o headache, body aches, eye pain, chest pain, cough, loss of taste and smell. Chest pain is located to the left upper chest that is constant in nature. Pain is not worse with breathing. Denies fevers. Has some mild shortness of breath. She has some diarrhea which has since improved.   She had a positive covid home test. She is breastfeeding.   Past Medical History:  Diagnosis Date  . Anemia   . Complication of anesthesia    pt. states it took her " time " to wake up   . HELLP (hemolytic anemia/elev liver enzymes/low platelets in pregnancy)   . Infection    UTI  . Ovarian cyst   . Pregnancy induced hypertension     Patient Active Problem List   Diagnosis Date Noted  . Gestational hypertension 03/13/2020  . Vaginal birth after cesarean 03/13/2020  . Encounter for induction of labor 03/11/2020  . 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 Marginal insertion of umbilical cord affecting management of mother 01/10/2020  . Supervision of high risk pregnancy, antepartum 12/12/2019  . History of HELLP syndrome, currently pregnant 12/12/2019  . S/P cesarean section 06/13/2015  . Language barrier 03/10/2015  . Syncope 03/10/2015  . AMA (advanced maternal age) multigravida 35+ 03/10/2015  . SS (short stature) 03/10/2015    Past Surgical History:  Procedure Laterality Date  . CESAREAN SECTION N/A 06/10/2015   Procedure: CESAREAN SECTION;  Surgeon: Jaymes Graff, MD;  Location: [facility];  Service: Obstetrics;  Laterality: N/A;     OB History    Gravida  3   Para  2   Term  2   Preterm      AB  1    Living  2     SAB  0   TAB  0   Ectopic  1   Multiple  0   Live Births  2           Family History  Problem Relation Age of Onset  . Hypertension Maternal Grandfather   . Cancer Maternal Grandfather        leukemia  . Varicose Veins Mother   . Hyperlipidemia Father     Social History   Tobacco Use  . Smoking status: Never Smoker  . Smokeless tobacco: Never Used  Vaping Use  . Vaping Use: Never used  Substance Use Topics  . Alcohol use: No  . Drug use: No    Home Medications Prior to Admission medications   Medication Sig Start Date End Date Taking? Authorizing Provider  amLODipine (NORVASC) 5 MG tablet Take 1 tablet (5 mg total) by mouth daily. Patient not taking: Reported on 04/22/2020 03/14/20   Reva Bores, MD  ibuprofen (ADVIL) 600 MG tablet Take 1 tablet (600 mg total) by mouth every 6 (six) hours. Patient not taking: Reported on 03/17/2020 03/14/20   Reva Bores, MD  norgestimate-ethinyl estradiol (ORTHO-CYCLEN) 0.25-35 MG-MCG tablet Take 1 tablet by mouth daily. 04/22/20   Armando Reichert, CNM  Prenatal Multivit-Min-Fe-FA (PRENATAL/IRON) TABS Take 1 tablet  by mouth daily.     [provider]    Allergies    Patient has no known allergies.  Review of Systems   Review of Systems  Constitutional: Negative for fever.  HENT: Negative for ear pain and sore throat.        Loss taste and smell  Eyes: Positive for pain. Negative for visual disturbance.  Respiratory: Positive for cough. Negative for shortness of breath.   Cardiovascular: Negative for chest pain.  Gastrointestinal: Positive for diarrhea. Negative for abdominal pain, constipation, nausea and vomiting.  Genitourinary: Negative for dysuria and hematuria.  Musculoskeletal: Positive for myalgias.  Skin: Negative for color change and rash.  Neurological: Positive for headaches.  All other systems reviewed and are negative.   Physical Exam Updated Vital Signs BP 117/84    Pulse 89   Temp 98.5 F (36.9 C) (Oral)   Resp 13   Ht 4\' 11"  (1.499 m)   Wt 46.7 kg   LMP 06/23/2019 (Exact Date)   SpO2 98%   BMI 20.80 kg/m   Physical Exam Vitals and nursing note reviewed.  Constitutional:      General: She is not in acute distress.    Appearance: She is well-developed.  HENT:     Head: Normocephalic and atraumatic.  Eyes:     Conjunctiva/sclera: Conjunctivae normal.  Cardiovascular:     Rate and Rhythm: Normal rate and regular rhythm.     Heart sounds: No murmur heard.   Pulmonary:     Effort: Pulmonary effort is normal. No respiratory distress.     Breath sounds: Normal breath sounds.  Chest:     Chest wall: Tenderness (left upper chest) present.  Abdominal:     Palpations: Abdomen is soft.     Tenderness: There is no abdominal tenderness.  Musculoskeletal:     Cervical back: Neck supple.  Skin:    General: Skin is warm and dry.  Neurological:     Mental Status: She is alert.     ED Results / Procedures / Treatments   Labs (all labs ordered are listed, but only abnormal results are displayed) Labs Reviewed  CBC WITH DIFFERENTIAL/PLATELET - Abnormal; Notable for the following components:      Result Value   WBC 3.2 (*)    Hemoglobin 11.8 (*)    Lymphs Abs 0.6 (*)    All other components within normal limits  RESP PANEL BY RT-PCR (FLU A&B, COVID) ARPGX2  COMPREHENSIVE METABOLIC PANEL  TROPONIN I (HIGH SENSITIVITY)  TROPONIN I (HIGH SENSITIVITY)    EKG None  Radiology DG Chest Portable 1 View  Result Date: 04/27/2020 CLINICAL DATA:  Chest pain, fatigue.  COVID-19 exposure EXAM: PORTABLE CHEST 1 VIEW COMPARISON:  None. FINDINGS: The heart size and mediastinal contours are within normal limits. Hazy interstitial opacities within the mid to lower lung fields, right worse than left. No pleural effusion or pneumothorax. The visualized skeletal structures are unremarkable. IMPRESSION: Hazy interstitial opacities within the mid to lower  lung fields, right worse than left, suspicious for atypical/viral pneumonia. Electronically Signed   By: 04/29/2020 D.O.   On: 04/27/2020 13:44    Procedures Procedures (including critical care time)  Medications Ordered in ED Medications  acetaminophen (TYLENOL) tablet 650 mg (650 mg Oral Given 04/27/20 1357)    ED Course  I have reviewed the triage vital signs and the nursing notes.  Pertinent labs & imaging results that were available during my care of the patient were reviewed  by me and considered in my medical decision making (see chart for details).    MDM Rules/Calculators/A&P                          39 year old female presenting for evaluation of chest pain.  Had positive Covid home test 1 week ago.  Reviewed/interpreted labs CBC with leukopenia  Chest x-ray - Hazy interstitial opacities within the mid to lower lung fields, right worse than left, suspicious for atypical/viral pneumonia.  At shift change, patient pending laboratory work.  Care transition to Lyndel Safe, PA-C with plan to follow-up on pending labs.  Patient likely appropriate for discharge home.  Low suspicion for ACS or PE.  Suspect this is MSK cause or related to pleurisy in setting of known Covid  Final Clinical Impression(s) / ED Diagnoses Final diagnoses:  None    Rx / DC Orders ED Discharge Orders    None       Karrie Meres, PA-C 04/27/20 1518    Karrie Meres, PA-C 04/27/20 1519    Tegeler, Canary Brim, MD 04/27/20 1624

## 2020-04-27 NOTE — ED Notes (Signed)
Patient maintained O2 sats >96 while ambulating in room

## 2020-04-28 ENCOUNTER — Other Ambulatory Visit: Payer: 59

## 2020-04-28 ENCOUNTER — Telehealth (HOSPITAL_COMMUNITY): Payer: Self-pay | Admitting: Emergency Medicine

## 2020-04-28 NOTE — Telephone Encounter (Signed)
Called Spanish interpreter Leonardo [phone]. Called pt and explained possible monoclonal antibody treatment. Pt said she is feeling better and does not need treatment at this time. Left mAb number if she needs future treatment [phone].

## 2020-05-12 ENCOUNTER — Other Ambulatory Visit: Payer: 59

## 2020-10-27 IMAGING — US US MFM OB DETAIL+14 WK
1 series · 13 of 28 positions shown · non-contrast
Comparison: none

[Series 2: us mfm ob detail+14 wk · 116 acquisitions, 13 frames shown]
[im 5/116]
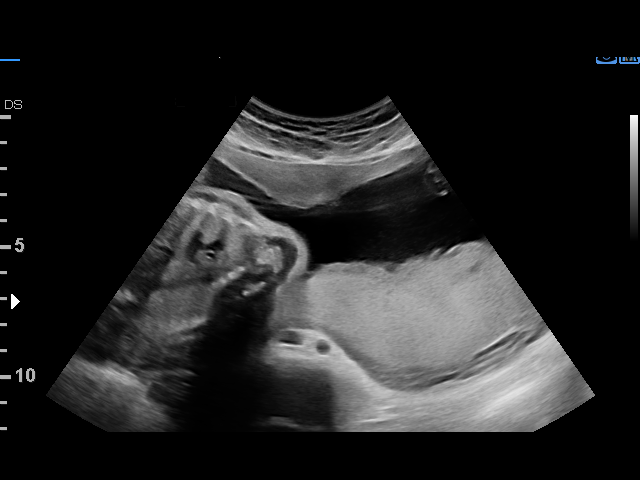
[im 13/116]
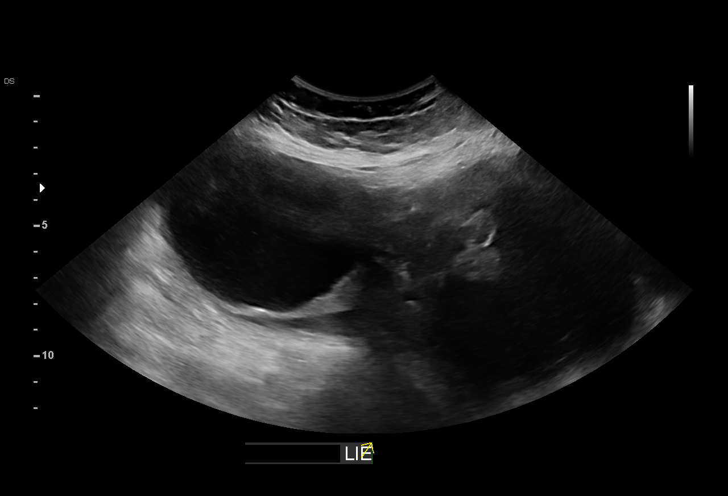
[im 22/116]
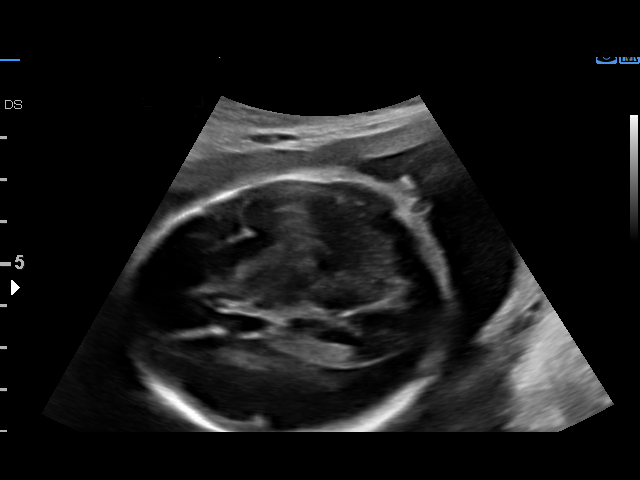
[im 30/116]
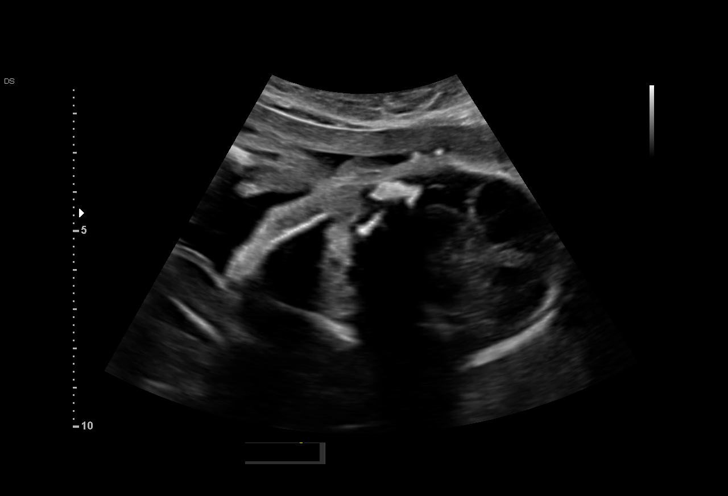
[im 39/116]
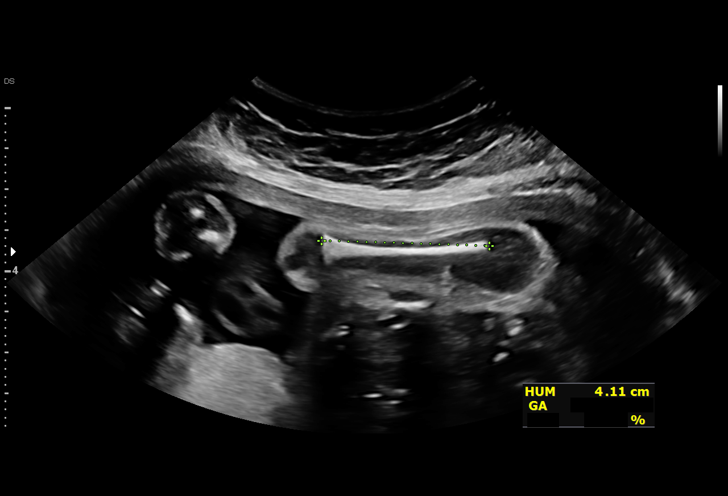
[im 47/116]
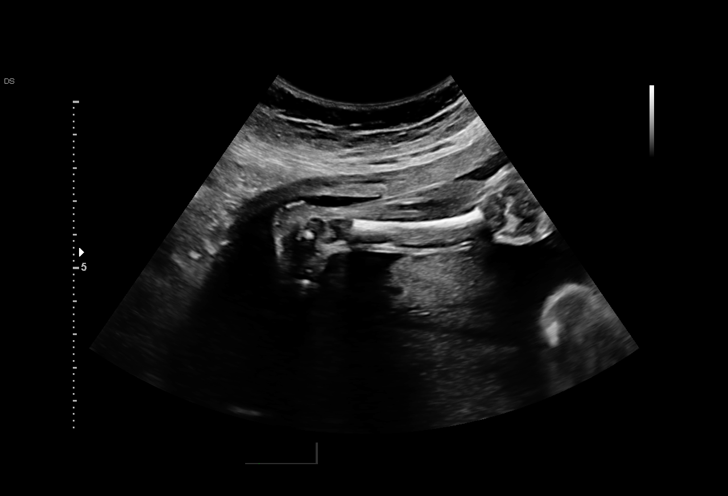
[im 60/116]
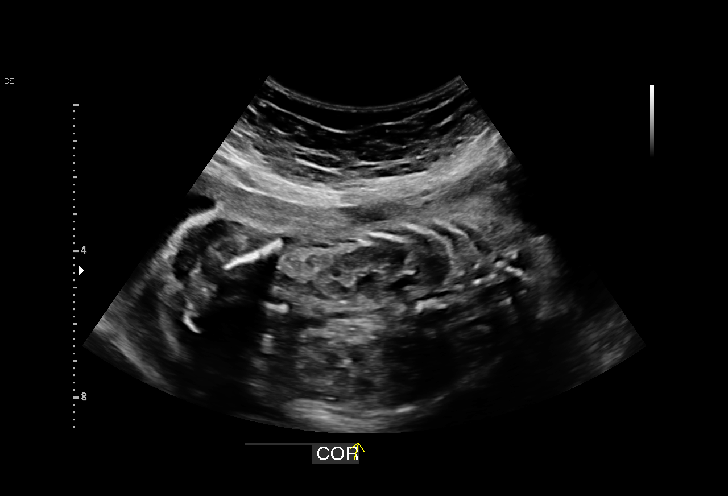
[im 69/116]
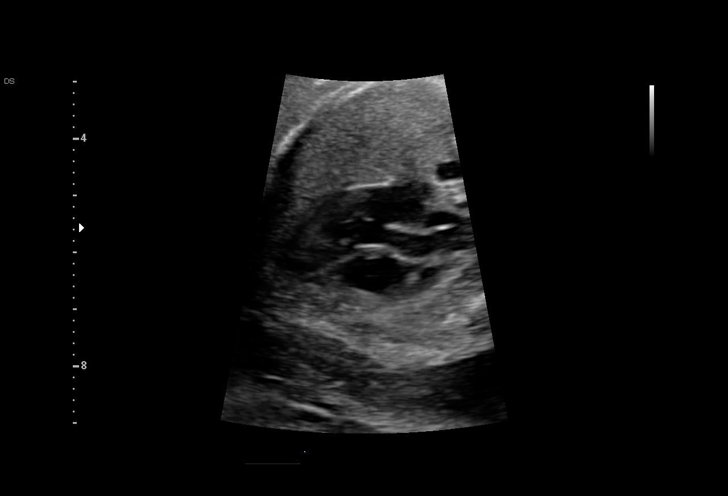
[im 77/116]
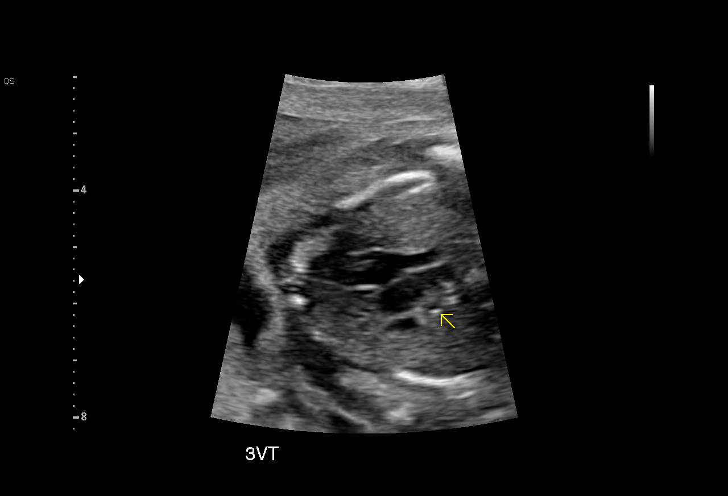
[im 86/116]
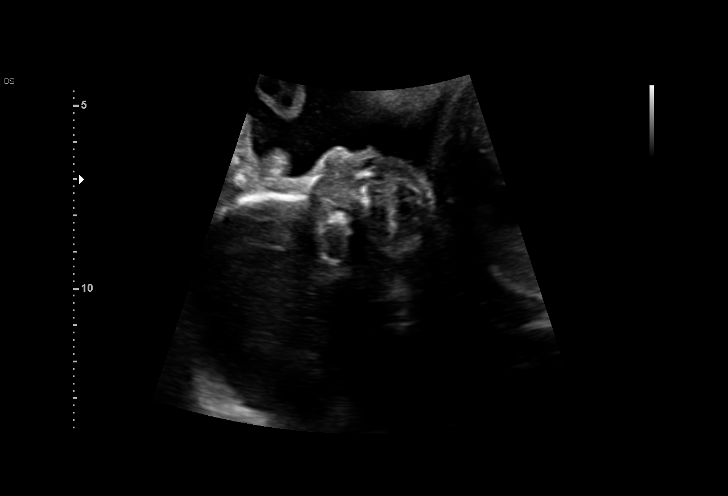
[im 94/116]
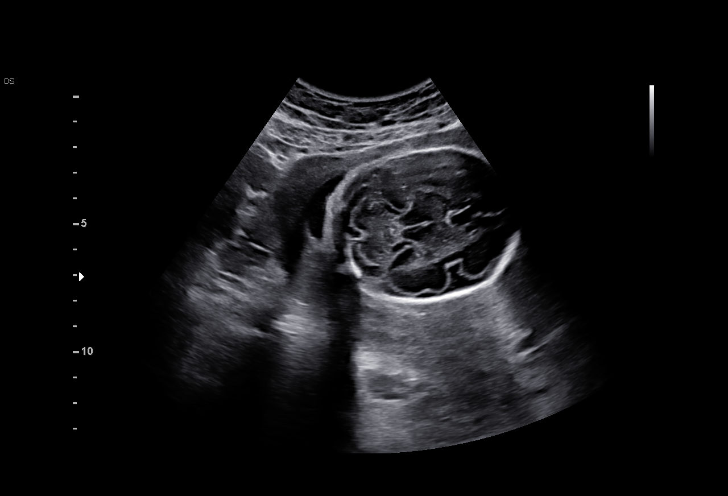
[im 103/116]
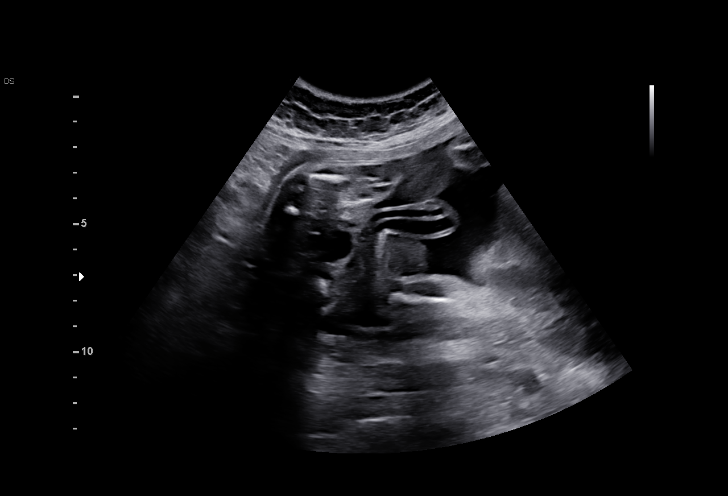
[im 111/116]
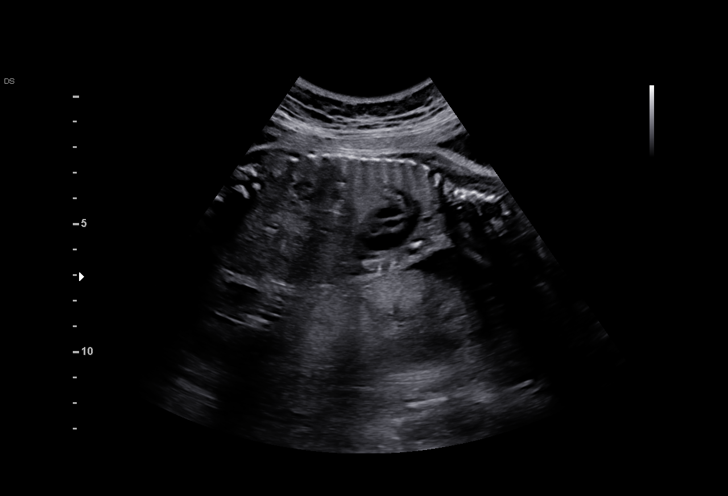

[13 of 28 positions shown; findings below may reference images not displayed]

JIM


Indications

 Advanced maternal age multigravida 35+,
 second trimester
 Previous cesarean delivery, antepartum
 25 weeks gestation of pregnancy
 Hx HELLP
Fetal Evaluation

 Num Of Fetuses:         1
 Cardiac Activity:       Observed
 Fetal Lie:              Variable
 Presentation:           Variable
 Placenta:               Posterior Left Horn
 P. Cord Insertion:      Marginal insertion

 Amniotic Fluid
 AFI FV:      Within normal limits

 Comment:    Possible Bicornuate uterus. Posterior placenta in left uterine
             horn. Marginal Cord insertion
Biometry

 BPD:      61.4  mm     G. Age:  25w 0d         21  %    CI:        74.21   %    70 - 86
                                                         FL/HC:      19.8   %    18.6 -
 HC:      226.3  mm     G. Age:  24w 5d          6  %    HC/AC:      1.11        1.04 -
 AC:      204.4  mm     G. Age:  25w 0d         25  %    FL/BPD:     72.8   %    71 - 87
 FL:       44.7  mm     G. Age:  24w 5d         15  %    FL/AC:      21.9   %    20 - 24
 HUM:      40.8  mm     G. Age:  24w 5d         24  %
 CER:      27.9  mm     G. Age:  24w 6d         29  %
 Est. FW:     746  gm    1 lb 10 oz      15  %
Gestational Age

 LMP:           25w 4d        Date:  06/23/19                 EDD:   03/29/20
 U/S Today:     24w 6d                                        EDD:   04/03/20
 Best:          25w 4d     Det. By:  LMP  (06/23/19)          EDD:   03/29/20
Anatomy

 Cranium:               Appears normal         LVOT:                   Appears normal
 Cavum:                 Appears normal         Aortic Arch:            Appears normal
 Ventricles:            Appears normal         Ductal Arch:            Appears normal
 Choroid Plexus:        Appears normal         Diaphragm:              Appears normal
 Cerebellum:            Appears normal         Stomach:                Appears normal, left
                                                                       sided
 Posterior Fossa:       Appears normal         Abdomen:                Appears normal
 Nuchal Fold:           Not applicable (>20    Abdominal Wall:         Appears nml (cord
                        wks GA)                                        insert, abd wall)
 Face:                  Appears normal         Cord Vessels:           Appears normal (3
                        (orbits and profile)                           vessel cord)
 Lips:                  Appears normal         Kidneys:                Appear normal
 Palate:                Appears normal         Bladder:                Appears normal
 Thoracic:              Appears normal         Spine:                  Appears normal
 Heart:                 Appears normal         Upper Extremities:      Appears normal
                        (4CH, axis, and
                        situs)
 RVOT:                  Appears normal         Lower Extremities:      Appears normal

 Other:  Heels seen, Unusal postition of right hand. Both 5th digits seen.
Comments

 This patient was seen for a detailed fetal anatomy scan due
 to advanced maternal age.  She has a prior cesarean delivery
 and history of HELLP syndrome.
 She denies any other significant past medical history and
 denies any problems in her current pregnancy.
 The results of her cell free DNA test are currently pending.
 She was informed that the fetal growth and amniotic fluid
 level were appropriate for her gestational age.
 There were no obvious fetal anomalies noted on today's
 ultrasound exam.
 The patient was informed that anomalies may be missed due
 to technical limitations. If the fetus is in a suboptimal position
 or maternal habitus is increased, visualization of the fetus in
 the maternal uterus may be impaired.
 A marginal placental cord insertion was noted on today's
 ultrasound exam.  The small association between fetal growth
 restriction and a marginal placental cord insertion was
 discussed with the patient today.  Due to this indication, we
 will continue to follow her with serial growth ultrasounds.
 The increased risk of fetal aneuploidy due to advanced
 maternal age was discussed. Due to advanced maternal age,
 the patient was offered and declined an amniocentesis today
 for definitive diagnosis of fetal aneuploidy.
 As she will be over 40 years old at the time of delivery,
 weekly fetal testing should be considered starting at around
 36 weeks.
 A follow-up exam was scheduled in 4 weeks.
 All conversations were held with the patient today with the
 help of a Spanish interpreter.

## 2021-03-06 IMAGING — DX DG CHEST 1V PORT
1 series · 1 of 1 positions shown · non-contrast
Comparison: None.

CLINICAL DATA: Chest pain, fatigue.  0EXBD-5W exposure

EXAM:
PORTABLE CHEST 1 VIEW

[chest ap]
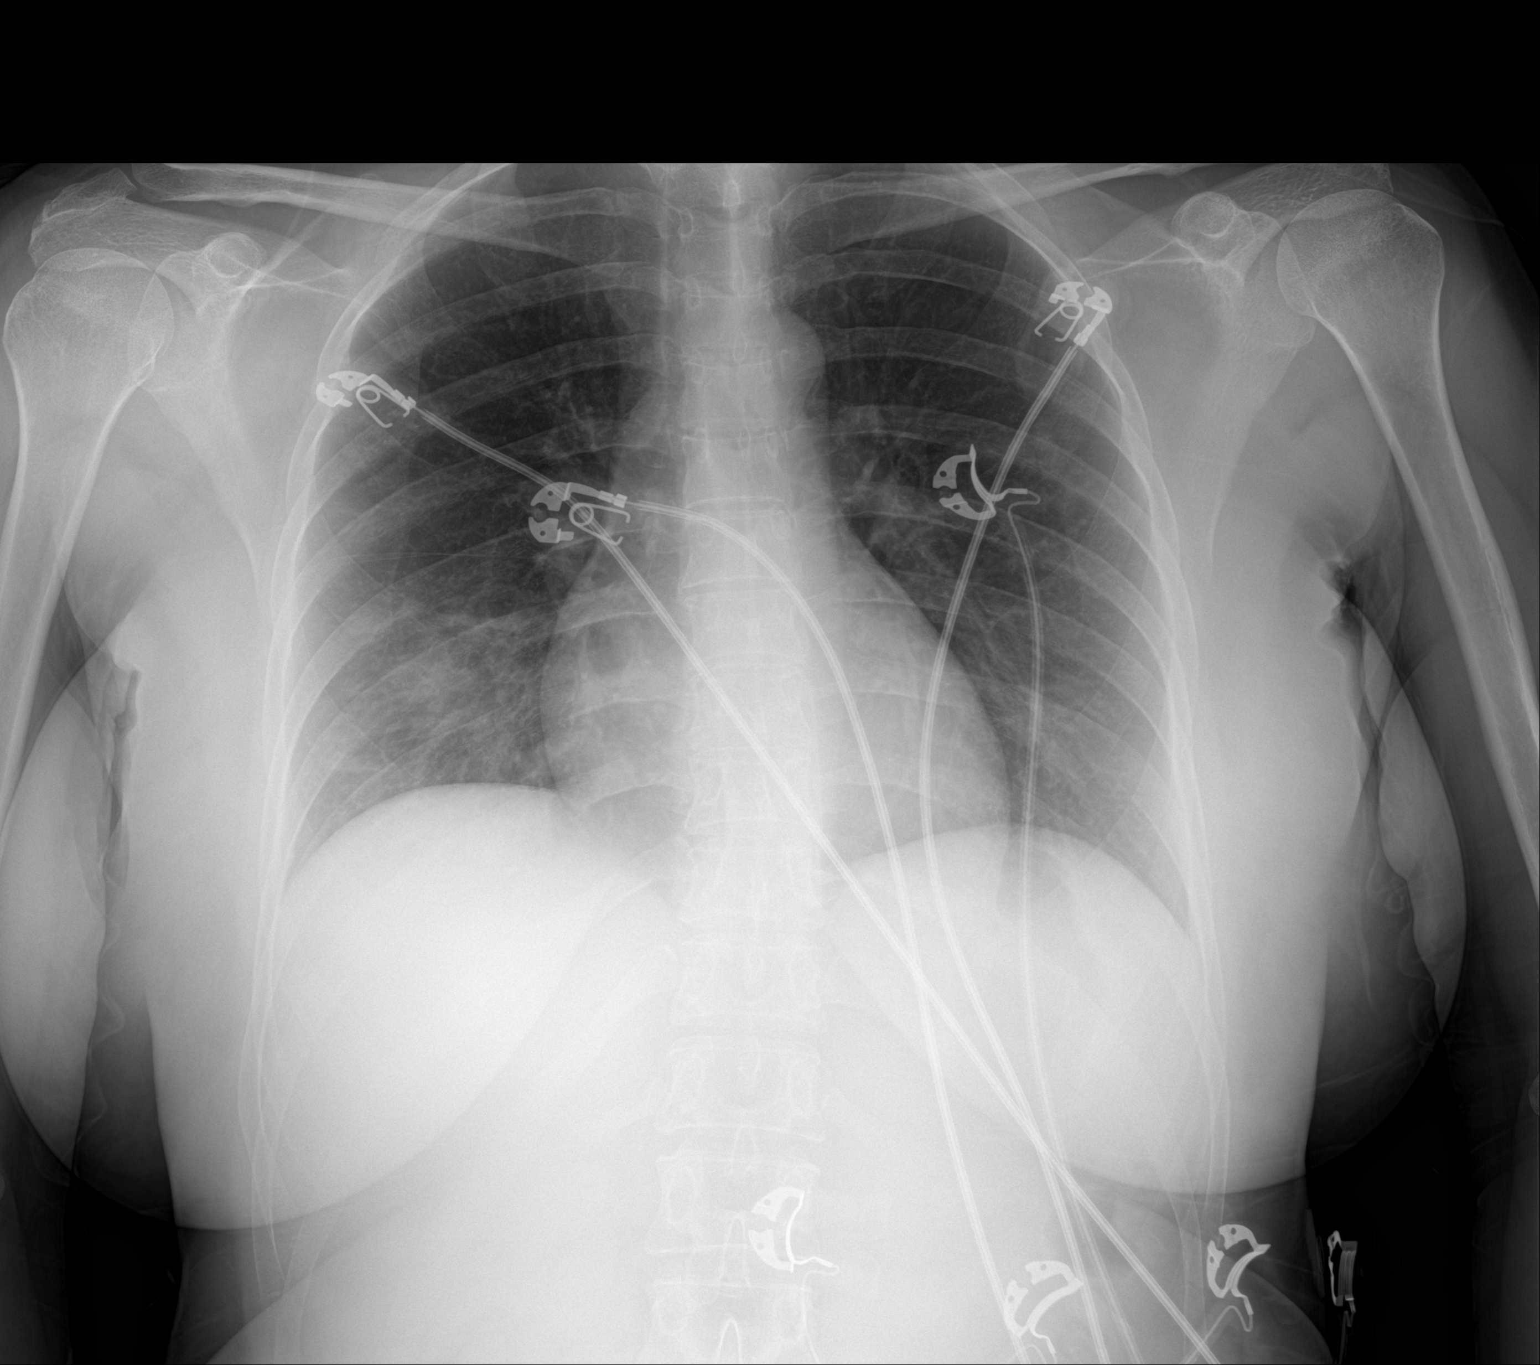

[1 of 1 positions shown; findings below may reference images not displayed]

FINDINGS: The heart size and mediastinal contours are within normal limits.
Hazy interstitial opacities within the mid to lower lung fields,
right worse than left. No pleural effusion or pneumothorax. The
visualized skeletal structures are unremarkable.
IMPRESSION: Hazy interstitial opacities within the mid to lower lung fields,
right worse than left, suspicious for atypical/viral pneumonia.

## 2022-08-03 ENCOUNTER — Emergency Department (HOSPITAL_BASED_OUTPATIENT_CLINIC_OR_DEPARTMENT_OTHER)
Admission: EM | Admit: 2022-08-03 | Discharge: 2022-08-03 | Disposition: A | Discharge status: Home / Self Care | Payer: 59 | Attending: Emergency Medicine | Admitting: Emergency Medicine

## 2022-08-03 DIAGNOSIS — Z5321 Procedure and treatment not carried out due to patient leaving prior to being seen by health care provider: Secondary | ICD-10-CM | POA: Insufficient documentation

## 2022-08-03 DIAGNOSIS — R519 Headache, unspecified: Secondary | ICD-10-CM | POA: Diagnosis present

## 2022-08-03 NOTE — ED Notes (Signed)
Na x 1 for triage

## 2022-08-03 NOTE — ED Notes (Signed)
Na x 3 for triage.

## 2022-08-03 NOTE — ED Notes (Signed)
Na x 2 for triage
